# Patient Record
Sex: Female | Born: 1970 | Race: Black or African American | Hispanic: No | Marital: Single | Smoking: Never smoker
Health system: Southern US, Community
[De-identification: ages and names within clinical notes are randomized; demographics above are authoritative.]

## PROBLEM LIST (undated history)

## (undated) DIAGNOSIS — E119 Type 2 diabetes mellitus without complications: Secondary | ICD-10-CM

## (undated) DIAGNOSIS — J45909 Unspecified asthma, uncomplicated: Secondary | ICD-10-CM

## (undated) DIAGNOSIS — D649 Anemia, unspecified: Secondary | ICD-10-CM

## (undated) HISTORY — PX: CHOLECYSTECTOMY: SHX55

---

## 2017-06-11 DIAGNOSIS — J101 Influenza due to other identified influenza virus with other respiratory manifestations: Secondary | ICD-10-CM

## 2017-06-11 NOTE — ED Notes (Signed)
TRANSFER - IN REPORT:    [facility] on [employer]  being received from ER(unit) for routine progression of care      Report consisted of patient???s Situation, Background, Assessment and   Recommendations(SBAR).     Information from the following report(s) SBAR was reviewed with the receiving nurse.    Opportunity for questions and clarification was provided.      Assessment completed upon patient???s arrival to unit and care assumed.

## 2017-06-11 NOTE — ED Notes (Signed)
Patient assisted to the bathroom--  Then taken to EDOBS by wheelchair.

## 2017-06-11 NOTE — ED Notes (Signed)
Patient is alert.  Aware of plan of care.   Resting at this time.  No complaints.  Call light within reach.

## 2017-06-11 NOTE — ED Triage Notes (Signed)
Per EMS last VS, BP 139/91, HR 113, temp 98.9F, 100% on 4L NC.

## 2017-06-11 NOTE — ED Provider Notes (Signed)
EMERGENCY DEPARTMENT HISTORY AND PHYSICAL EXAM    8:11 PM      Date: 06/11/2017  Patient Name: Jennifer Brock    History of Presenting Illness     Chief Complaint   Patient presents with   ??? Respiratory Distress     Patient with history of asthma, diabetes presents as a transfer via EMS.  [facility], she is being treated for asthma exacerbation elevated blood sugar, had a CTA of the chest which identified new adenopathy, questionable sarcoidosis lymphoma or other malignancy, no pulmonologist was available there so she was transferred for further evaluation.  She is sleepy here, reports just receiving Ativan, she had reported seizure at 2 PM today, CT of the head was negative and was transferred for further management.  She has no pain at this time.    PCP: Other, Phys, MD    Current Facility-Administered Medications   Medication Dose Route Frequency Provider Last Rate Last Dose   ??? ondansetron (ZOFRAN) injection 4 mg  4 mg IntraVENous Q4H PRN Despina HickSharkey, Shaylynn Nulty M, MD       ??? 0.9% sodium chloride infusion  125 mL/hr IntraVENous CONTINUOUS Despina HickSharkey, Orson Rho M, MD 125 mL/hr at 06/11/17 2043 125 mL/hr at 06/11/17 2043   ??? albuterol (PROVENTIL VENTOLIN) nebulizer solution 2.5 mg  2.5 mg Nebulization Q2H PRN Despina HickSharkey, Shakoya Gilmore M, MD           Past History     Past Medical History:  No past medical history on file.    Past Surgical History:  No past surgical history on file.    Family History:  No family history on file.    Social History:  Social History     Tobacco Use   ??? Smoking status: Not on file   Substance Use Topics   ??? Alcohol use: Not on file   ??? Drug use: Not on file       Allergies:  No Known Allergies      Review of Systems     Review of Systems   Unable to perform ROS: Mental status change   Constitutional: Negative for fever.   HENT: Negative for nosebleeds.    Eyes: Negative for visual disturbance.   Respiratory: Positive for cough and shortness of breath.     Cardiovascular: Negative for chest pain and leg swelling.   Gastrointestinal: Negative for blood in stool.   Genitourinary: Negative for dysuria.   Musculoskeletal: Negative for myalgias.   Skin: Negative for rash.   Neurological: Negative for headaches.   All other systems reviewed and are negative.             Visit Vitals  BP 102/64   Pulse 92   Temp 98 ??F (36.7 ??C)   Resp 30   Ht 5\' 4"  (1.626 m)   Wt 76.7 kg (169 lb)   SpO2 99%   BMI 29.01 kg/m??           Physical Exam / Medical Decision Making   I am the first provider for this patient.    I reviewed the vital signs, available nursing notes, past medical history, past surgical history, family history and social history.    Vital Signs-Reviewed the patient's vital signs.  Physical exam:  General:  Well-developed, well-nourished, no apparent distress.    Head:  Normocephalic atraumatic.    Eyes:  Pupils midrange extraocular movements intact.  No pallor or conjunctival injection.    Nose:  No rhinorrhea, inspection grossly normal.    Ears:  Grossly normal to inspection, no discharge.    Mouth:  Mucous membranes moist, no appreciable intraoral lesion.    Neck/Back:  Trachea midline, no asymmetry.    Chest:  Grossly normal inspection, symmetric chest rise.    Pulmonary: Rhonchi throughout no wheezing or labored respirations.    Cardiovascular:  S1-S2 no murmurs rubs or gallops.    Abdomen: Soft, nontender, nondistended no guarding rebound or peritoneal signs.    Extremities:  Grossly normal to inspection, peripheral pulses intact  no pain on palpation of calves no asymmetry  Neurologic: Sleepy but arousable and able to hold a full conversation no appreciable focal neurologic deficit    Skin:  Warm and dry  Psychiatric:  Grossly normal mood and affect  Nursing note reviewed, vital signs reviewed.    ED course:  Patient presents with cough, recently diagnosed flu a positive, questionable seizure or syncope at [facility]ident today, CTA yesterday identifying  adenopathy questionable malignancy or lymphoma or sarcoidosis presents for evaluation, here she is tachypneic 100% on supplemental oxygen, no hypotension borderline tachycardia present..  She is sleepy here recently received 2 more grams of Ativan, likely related to that toxidrome but will repeat her blood sugar and potassium      On review of EMR HIV negative BNP normal blood blood cell count 13.76 hemoglobin 11.0, platelets 739 blood cultures no growth to date ??2 flu a positive on 06/09/17    Chest x-ray done today at 2:46 PM no acute findings    CT head done today without contrast at 2:44 PM negative CT of the head        Patient's presentation, history, physical exam and laboratory evaluations were reviewed.  I felt the patient would benefit from inpatient management and treatment.     Consult:  Discussed care with Dr. Corinda Gubler. Standard discussion; including history of patient???s chief complaint, available diagnostic results, and treatment course.  Patient was accepted to their service.          Disposition:    Admitted to medicine service      Portions of this chart were created with Dragon medical speech to text program.   Unrecognized errors may be present.          Diagnostic Study Results     Labs -  Recent Results (from the past 12 hour(s))   GLUCOSE, POC    Collection Time: 06/11/17  8:18 PM   Result Value Ref Range    Glucose (POC) 130 (H) 65 - 105 mg/dL       Radiologic Studies -   No orders to display       Diagnosis     Clinical Impression:   1. Influenza A    2. Abnormal CT of the chest          Follow-up Information    None             Medication List      You have not been prescribed any medications.

## 2017-06-11 NOTE — ED Notes (Signed)
TRANSFER - OUT REPORT:    Verbal report given  on Jennifer Brock  being transferred to EDOBS # 3 for routine progression of care       Report consisted of patient???s Situation, Background, Assessment and   Recommendations(SBAR).     Information from the following report(s) SBAR was reviewed with the receiving nurse.    Lines:       Opportunity for questions and clarification was provided.      Patient transported with:   [employer]

## 2017-06-11 NOTE — ED Triage Notes (Signed)
Jennifer JewsRhonda Brock ([facility]): 47 year old female (Full code) admitted for "asthma" continues to have shortness of breath with persistent wheezing and now found to have significant acute hilar adenopathy concerning for sarcoid or lymphoma.  They do not have a pulmonologist, patient continues to need hospitalization but they cannot go further after having maximized her treatment there.  Contact precautions for MRSA in nares  Flu positive.  CT scan of chest yesterday - abnormal adenopathy  Hx GERD, Seasonal allergies, asthma, sleep apnea, DM  Had a possible seizure at 1400, 2mg  IV ativan given, 2mg  Mg given  Lactic Acid 4.6, 1L NS  Potassium 2.3  23950mL/hr NS 40 mEq maintenance fluid.   AOx4  HIV panel & TB skin test (Rt lower forearm), Candidisis in (Lt Forearm) ordered at [facility]  98.0. P103, r20, 100% 3L NC, 125/85, no pain  20g RAC, 20g R hand  ETA 1830

## 2017-06-11 NOTE — H&P (Signed)
Hospitalist Admission History and Physical    NAME:  Jennifer Brock   DOB:   09-25-1970   MRN:   [id number]     PCP:  Sol Blazing, MD  Admission Date/Time:  06/11/2017 9:21 PM  Anticipated Date of Discharge: 2/23  Anticipated Disposition (home, SNF) : HH       Assessment/Plan:      Active Problems:    Influenza A (06/11/2017)       Asthma exacerbation  Acute influenza A  Abnormal perihilar mass and mediastinal lymphadenopathy  Uncontrolled type 2 diabetes mellitus with hyperglycemia new onset      ___________________________________________________  PLAN:    Admit to telemetry  We will consult pulmonary, for which the patient was transferred to our hospital rule out lymphoma  Glucommander for hyperglycemia  Continue treatment for influenza  Oxygen, nebs keep sats more than 92%  Continue montelukast, Symbicort    Risk of deterioration:  [] Low    [x] Moderate  [] High    Prophylaxis:  [] Lovenox  [] Coumadin  [x] Hep SQ  [] SCD???s  [] H2B/PPI    Disposition:  [] Home w/ Family   [x] HH PT,OT,RN   [] SNF/LTC   [] SAH/Rehab             Subjective:   CHIEF COMPLAINT:    Chief Complaint   Patient presents with   ??? Respiratory Distress       HISTORY OF PRESENT ILLNESS:     Jennifer Brock is a 47 y.o. BLACK OR [place] female who presents to outside hospital for shortness of breath and hyperglycemia  Was seen a few days before that for cough and was treated with steroids and cough medication and sent home but came back with worsening shortness of breath despite using duo nebs at home  She has history of asthma since a child and has been on inhaler for the same  Patient had evaluation as an inpatient done in procedure with steroids, magnesium with improvement of symptoms and later had CT scan of the chest performed which showed adenopathy suggestive of possible lymphoma versus sarcoidosis  She was started on insulin for hyperglycemia and was started amlodipine for hypertension in addition to replacement of potassium for hypokalemia         Past Medical History:   Diagnosis Date   ??? Asthma   ??? Diabetes   ??? GERD (gastroesophageal reflux disease)   ??? Hypertension   ??? Obesity   ??? Seasonal allergies   ??? Sleep apnea           Past Surgical History:   Procedure Laterality Date   ??? SUR - CHOLECYSTECTOMY   1995   ??? SUR - LIGATION TUBAL   1999           Social History     Tobacco Use   ??? Smoking status: Not on file   Substance Use Topics   ??? Alcohol use: Not on file          Social:  Family History   Problem Relation Name Age of Onset   ??? Hypertension Mother   ??? Asthma Paternal Uncle   ??? Asthma Maternal Uncle   ??? Asthma Other   Social History     Tobacco Use   ??? Smoking status: Never Smoker   ??? Smokeless tobacco: Never Used   Substance Use Topics   ??? Alcohol use: No   ??? Drug use: No         Prior to Admission medications   Medication  Sig Start Date End Date Taking? Authorizing Provider   albuterol (PROVENTIL, VENTOLIN) 90 mcg/Actuation Inhalation HFA Aerosol Inhaler Take 1-2 Puffs by inhalation every 4 hours as needed for Wheezing. 09/25/09 Yes Madigan, Herminio Heads, MD   albuterol-ipratropium (DUONEB) 0.5 mg-3 mg(2.5 mg base)/3 mL Inhalation Solution for Nebulization Take 3 mL by inhalation four times a day. 04/08/09 Yes Vanetta Shawl, DO   amLODIPine (NORVASC) 10 mg Oral Tablet Take 10 mg by mouth daily. Yes Reported, Patient   cetirizine (ZYRTEC) 10 mg Oral Tablet Take 1 Tab by mouth daily. For allergy and congestion 11/26/15 Yes Ripley Fraise, MD   codeine-guaifenesin (ROBITUSSIN-AC) 10-100 mg/5 mL Oral Liquid Take 5-10 mL by mouth four times a day as needed for Cough. 06/05/17 Yes Braswell, Karmen Bongo, PA-C   fluticasone-salmeterol (ADVAIR DISKUS) 500-50 mcg/dose Inhalation Disk with Device Take 1 Puff by inhalation twice a day. 07/10/10 Yes Massie Kluver, MD   furosemide (LASIX) 20 mg Oral Tablet Take 20 mg by mouth daily as needed (swelling). Yes Reported, Patient   gabapentin (NEURONTIN) 100 mg Oral Capsule Take 100 mg by mouth at  bedtime. Yes Reported, Patient   INSULIN DETEMIR (LEVEMIR FLEXPEN SUBQ) Give 20 Units subcutaneous at bedtime. Yes Reported, Patient   magnesium oxide (MAG-OX) 400 mg Oral Tablet Take 1 Tab by mouth daily. 02/06/17 Yes Massie Kluver, MD   montelukast (SINGULAIR) 10 mg Oral Tab Take 10 mg by mouth every evening. Yes Reported, Patient   predniSONE (DELTASONE) 20 mg Oral Tablet Take 2 Tabs by mouth with breakfast for 5 days. 06/05/17 06/10/17 Yes Braswell, Karmen Bongo, PA-C   TRADJENTA 5 mg Oral Tablet Take 5 mg by mouth daily. 04/02/16 Yes Reported, Patient               None           REVIEW OF SYSTEMS:     []  Unable to obtain  ROS due to  [] mental status change  [] sedated   [] intubated   [x] Total of 12 systems reviewed as follows:  Constitutional: negative fever, negative chills, negative weight loss  Eyes:   negative visual changes  ENT:   negative sore throat, tongue or lip swelling  Respiratory:  Positive For shortness of breath  Cards:  negative for chest pain, palpitations, lower extremity edema  GI:   negative for nausea, vomiting, diarrhea, and abdominal pain  Genitourinary: negative for frequency, dysuria  Integument:  negative for rash and pruritus  Hematologic:  negative for easy bruising and gum/nose bleeding  Musculoskel: negative for myalgias,  back pain and muscle weakness  Neurological:  negative for headaches, dizziness, vertigo  Behavl/Psych: negative for feelings of anxiety, depression       Objective:   VITALS:    Visit Vitals  BP 102/64   Pulse 92   Temp 98 ??F (36.7 ??C)   Resp 30   Ht 5\' 4"  (1.626 m)   Wt 76.7 kg (169 lb)   SpO2 99%   BMI 29.01 kg/m??     Temp (24hrs), Avg:98 ??F (36.7 ??C), Min:98 ??F (36.7 ??C), Max:98 ??F (36.7 ??C)      PHYSICAL EXAM:   General:    Chronically sick looking in no apparent distress  Head:   Normocephalic, without obvious abnormality, atraumatic.  Eyes:   Conjunctivae clear, anicteric sclerae.  Pupils are equal  Nose:  Nares normal. No drainage or sinus tenderness.   Throat:    Lips, mucosa, and tongue normal.  No  Thrush  Neck:  Supple, symmetrical,  no adenopathy, thyroid: non tender    no carotid bruit and no JVD.  Back:    Symmetric,  No CVA tenderness.  Lungs:   Bilateral expiratory wheezing, decreased breath sound at the base  Chest wall:  No tenderness or deformity. No Accessory muscle use.  Heart:   Regular rate and rhythm,  no murmur, rub or gallop.  Abdomen:   Soft, non-tender. Not distended.  Bowel sounds normal. No masses  Extremities: Extremities normal, atraumatic, No cyanosis.  No edema. No clubbing  Skin:     Texture, turgor normal. No rashes or lesions.  Not Jaundiced  Psych:  Flat  effect.  Neurologic: EOMs intact. No facial asymmetry. No aphasia or slurred speech. Normal strength, Alert and oriented X 3.       LAB DATA REVIEWED:    Recent Results (from the past 12 hour(s))   GLUCOSE, POC    Collection Time: 06/11/17  8:18 PM   Result Value Ref Range    Glucose (POC) 130 (H) 65 - 105 mg/dL         Radiological Studies:   Results for orders placed or performed during the hospital encounter of 06/07/17 (from the past 168 hour(s))   XRAY CHEST 1 VIEW   Collection Time: 06/07/17 6:21 AM   Narrative   ONE VIEW CHEST    Clinical Indication: Cough.    Technique: Single frontal view of the chest was obtained.    Comparison: Multiple priors, most recently 06/05/2017.     Findings: The cardiomediastinal silhouette is normal in size and   configuration. Airway is midline. Pulmonary vasculature is normal in   appearance. There is no evidence of pulmonary consolidation or pleural   effusion. Visualized osseous structures are unremarkable.    Impression   Impression:   No acute cardiopulmonary process.    Reading Doctor: Madelaine BhatBonafe, Tiffany    Electronic Signature by: Madelaine BhatBonafe, Tiffany    CTA CHEST W & W/O IV CONTRAST   Collection Time: 06/10/17 1:54 PM   Narrative   CTA SCAN OF THE CHEST WITH CONTRAST    HISTORY: Effusion and bradycardia    COMPARISON: Chest x-ray 06/07/2017     TECHNIQUE: Thin slice axial images were obtained with 100 cc's of   Omnipaque 350 contrast. Multiplanar images were also obtained. Maximum   intensity projection algorithms were utilized in all CTA studies.     FINDINGS:    Thoracic Aorta: Normal sized thoracic aorta. No dissection.    Adenopathy: There is a prominent lymph node near the aortopulmonary window  on the left side measuring 15 x 25 mm in size. There also some mediastinal  lymph nodes with a short axis of 12 mm. There are subcarinal lymph nodes   present. There are also small hilar lymph nodes and 1 left perihilar mass   or lymph node extending up to the left upper lobe measuring 12 x 14 mm.Marland Kitchen.    Heart: Normal size heart. No pericardial effusion.    Lungs: Besides the left suprahilar density, no other masses, infiltrates,   pleural effusions or pneumothorax is evident..    Pulmonary arteries: No acute pulmonary most is identified.    Upper Abdomen: Clips from a previous cholecystectomy.    Skeletal Structures: No acute findings..    Impression   IMPRESSION:    1. No acute pulmonary embolus is identified  2. Abnormal adenopathy as described. The differential would include   lymphoma, metastatic disease  and granulomatous disease including   sarcoidosis.    Reading Doctor: Jeannine Kitten  Electronic Signature by: Jeannine Kitten   Results for orders placed or performed during the hospital encounter of 06/05/17 (from the past 168 hour(s))   XRAY CHEST 2 VIEWS   Collection Time: 06/05/17 5:47 PM   Narrative   INDICATION:   cough, fever, wheezing; hx of asthma .    TECHNIQUE:   PA and lateral    COMPARISON: 02/06/2017    FINDINGS:   The bilateral lung fields are clear. The cardiac silhouette is within   normal limits. The remaining osseous and soft tissue structures are   unremarkable.    Impression   IMPRESSION:   1. No radiographic evidence of acute chest disease.               Care Plan discussed with:      [x] Patient   [] Family    [] ED Care Manager  [] ED Doc   [] Specialist :    Total care time spent on reviewing the case/data/notes/EMR,examining the patient,documentation,coordinating care with nurses and consultants is 40 minutes.       ___________________________________________________  Admitting Physician: Roderic Scarce, MD     Dragon medical dictation software was used for portions of this report.  Unintended voice transcription errors may have occurred.

## 2017-06-11 NOTE — ED Notes (Signed)
Care assumed.  Bedside report from Jennifer E.  Patient slept through report.  Patient on contact and droplet precautions.  Isolation  Cart outside the room and signs up.  Whiteboard updated  Expectation is for admission--

## 2017-06-12 ENCOUNTER — Inpatient Hospital Stay
Admit: 2017-06-12 | Discharge: 2017-06-13 | Disposition: A | Discharge status: Home / Self Care | Payer: MEDICARE | Attending: Internal Medicine | Admitting: Internal Medicine

## 2017-06-12 LAB — TROPONIN I
Troponin-I: 0.023 ng/ml (ref 0.000–0.045)
Troponin-I: <0.015 ng/ml (ref 0.000–0.045)
Troponin-I: <0.015 ng/ml (ref 0.000–0.045)

## 2017-06-12 LAB — METABOLIC PANEL, BASIC
Anion gap: 9 mmol/L (ref 5–15)
BUN: 7 mg/dl (ref 7–25)
CO2: 29 mEq/L (ref 21–32)
Calcium: 8.1 mg/dl — ABNORMAL LOW (ref 8.5–10.1)
Chloride: 102 mEq/L (ref 98–107)
Creatinine: 0.5 mg/dl — ABNORMAL LOW (ref 0.6–1.3)
GFR est AA: >60
GFR est non-AA: >60
Glucose: 81 mg/dl (ref 74–106)
Potassium: 3.3 mEq/L — ABNORMAL LOW (ref 3.5–5.1)
Sodium: 141 mEq/L (ref 136–145)

## 2017-06-12 LAB — CBC W/O DIFF
HCT: 34.4 % — ABNORMAL LOW (ref 37.0–50.0)
HGB: 10.3 gm/dl — ABNORMAL LOW (ref 13.0–17.2)
MCH: 22.2 pg — ABNORMAL LOW (ref 25.4–34.6)
MCHC: 29.9 gm/dl — ABNORMAL LOW (ref 30.0–36.0)
MCV: 74.3 fL — ABNORMAL LOW (ref 80.0–98.0)
MPV: 9.3 fL (ref 6.0–10.0)
PLATELET: 641 10*3/uL — ABNORMAL HIGH (ref 140–450)
RBC: 4.63 M/uL (ref 3.60–5.20)
RDW-SD: 47.4 — ABNORMAL HIGH (ref 36.4–46.3)
WBC: 9 10*3/uL (ref 4.0–11.0)

## 2017-06-12 LAB — GLUCOSE, POC
Glucose (POC): 130 mg/dL — ABNORMAL HIGH (ref 65–105)
Glucose (POC): 73 mg/dL (ref 65–105)
Glucose (POC): 80 mg/dL (ref 65–105)
Glucose (POC): 231 mg/dL — ABNORMAL HIGH (ref 65–105)
Glucose (POC): 382 mg/dL — ABNORMAL HIGH (ref 65–105)

## 2017-06-12 LAB — POTASSIUM: Potassium: 2.8 mEq/L — CL (ref 3.5–5.1)

## 2017-06-12 MED ORDER — NALOXONE 0.4 MG/ML INJECTION
0.4 mg/mL | INTRAMUSCULAR | Status: DC | PRN
Start: 2017-06-12 — End: 2017-06-13

## 2017-06-12 MED ORDER — PREDNISONE 20 MG TAB
20 mg | Freq: Once | ORAL | Status: DC
Start: 2017-06-12 — End: 2017-06-13

## 2017-06-12 MED ORDER — INSULIN LISPRO 100 UNIT/ML INJECTION
100 unit/mL | Freq: Four times a day (QID) | SUBCUTANEOUS | Status: DC
Start: 2017-06-12 — End: 2017-06-13
  Administered 2017-06-12 – 2017-06-13 (×5): via SUBCUTANEOUS

## 2017-06-12 MED ORDER — HEPARIN (PORCINE) 5,000 UNIT/ML IJ SOLN
5000 unit/mL | Freq: Three times a day (TID) | INTRAMUSCULAR | Status: DC
Start: 2017-06-12 — End: 2017-06-13
  Administered 2017-06-12 – 2017-06-13 (×6): via SUBCUTANEOUS

## 2017-06-12 MED ORDER — PREDNISONE 10 MG TAB
10 mg | Freq: Once | ORAL | Status: DC
Start: 2017-06-12 — End: 2017-06-13

## 2017-06-12 MED ORDER — ONDANSETRON (PF) 4 MG/2 ML INJECTION
4 mg/2 mL | INTRAMUSCULAR | Status: DC | PRN
Start: 2017-06-12 — End: 2017-06-13

## 2017-06-12 MED ORDER — INSULIN GLARGINE 100 UNIT/ML INJECTION
100 unit/mL | Freq: Every evening | SUBCUTANEOUS | Status: DC
Start: 2017-06-12 — End: 2017-06-13
  Administered 2017-06-12 – 2017-06-13 (×2): via SUBCUTANEOUS

## 2017-06-12 MED ORDER — ALBUTEROL SULFATE 0.083 % (0.83 MG/ML) SOLN FOR INHALATION
2.5 mg /3 mL (0.083 %) | RESPIRATORY_TRACT | Status: DC | PRN
Start: 2017-06-12 — End: 2017-06-13

## 2017-06-12 MED ORDER — SODIUM CHLORIDE 0.9 % IV
INTRAVENOUS | Status: DC
Start: 2017-06-12 — End: 2017-06-11
  Administered 2017-06-12: 02:00:00 via INTRAVENOUS

## 2017-06-12 MED ORDER — INSULIN LISPRO 100 UNIT/ML INJECTION
100 unit/mL | SUBCUTANEOUS | Status: DC | PRN
Start: 2017-06-12 — End: 2017-06-13
  Administered 2017-06-13: 14:00:00 via SUBCUTANEOUS

## 2017-06-12 MED ORDER — ALBUTEROL SULFATE 2.5 MG/0.5 ML NEB SOLUTION
2.5 mg/0.5 mL | Freq: Four times a day (QID) | RESPIRATORY_TRACT | Status: DC
Start: 2017-06-12 — End: 2017-06-13
  Administered 2017-06-12 – 2017-06-13 (×5): via RESPIRATORY_TRACT

## 2017-06-12 MED ORDER — AMLODIPINE 5 MG TAB
5 mg | Freq: Every day | ORAL | Status: DC
Start: 2017-06-12 — End: 2017-06-11

## 2017-06-12 MED ORDER — BUDESONIDE-FORMOTEROL HFA 160 MCG-4.5 MCG/ACTUATION AEROSOL INHALER
Freq: Two times a day (BID) | RESPIRATORY_TRACT | Status: DC
Start: 2017-06-12 — End: 2017-06-11

## 2017-06-12 MED ORDER — SODIUM CHLORIDE 0.9 % IJ SYRG
INTRAMUSCULAR | Status: DC | PRN
Start: 2017-06-12 — End: 2017-06-13

## 2017-06-12 MED ORDER — POTASSIUM CHLORIDE SR 20 MEQ TAB, PARTICLES/CRYSTALS
20 mEq | ORAL | Status: AC
Start: 2017-06-12 — End: 2017-06-12
  Administered 2017-06-12: 07:00:00 via ORAL

## 2017-06-12 MED ORDER — OSELTAMIVIR PHOSPHATE 75 MG CAP
75 mg | Freq: Two times a day (BID) | ORAL | Status: DC
Start: 2017-06-12 — End: 2017-06-13
  Administered 2017-06-12 – 2017-06-13 (×3): via ORAL

## 2017-06-12 MED ORDER — DEXTROSE 50% IN WATER (D50W) IV
INTRAVENOUS | Status: DC | PRN
Start: 2017-06-12 — End: 2017-06-13

## 2017-06-12 MED ORDER — SODIUM CHLORIDE 0.9 % IJ SYRG
Freq: Three times a day (TID) | INTRAMUSCULAR | Status: DC
Start: 2017-06-12 — End: 2017-06-13
  Administered 2017-06-12 – 2017-06-13 (×6): via INTRAVENOUS

## 2017-06-12 MED ORDER — FLUTICASONE 200 MCG-VILANTEROL 25 MCG/DOSE BREATH ACTIVATED INHALER
200-25 mcg/dose | Freq: Every day | RESPIRATORY_TRACT | Status: DC
Start: 2017-06-12 — End: 2017-06-13
  Administered 2017-06-12 – 2017-06-13 (×2): via RESPIRATORY_TRACT

## 2017-06-12 MED ORDER — PREDNISONE 20 MG TAB
20 mg | Freq: Once | ORAL | Status: AC
Start: 2017-06-12 — End: 2017-06-12
  Administered 2017-06-12: 17:00:00 via ORAL

## 2017-06-12 MED ORDER — OSELTAMIVIR PHOSPHATE 75 MG CAP
75 mg | ORAL | Status: AC
Start: 2017-06-12 — End: 2017-06-11
  Administered 2017-06-12 (×2): via ORAL

## 2017-06-12 MED ORDER — MONTELUKAST 10 MG TAB
10 mg | Freq: Every evening | ORAL | Status: DC
Start: 2017-06-12 — End: 2017-06-13
  Administered 2017-06-12 – 2017-06-13 (×2): via ORAL

## 2017-06-12 MED ORDER — SODIUM CHLORIDE 0.9 % IV
INTRAVENOUS | Status: DC
Start: 2017-06-12 — End: 2017-06-12
  Administered 2017-06-12: 02:00:00 via INTRAVENOUS

## 2017-06-12 MED ORDER — GLUCAGON 1 MG INJECTION
1 mg | INTRAMUSCULAR | Status: DC | PRN
Start: 2017-06-12 — End: 2017-06-13

## 2017-06-12 MED ORDER — PREDNISONE 20 MG TAB
20 mg | Freq: Once | ORAL | Status: AC
Start: 2017-06-12 — End: 2017-06-13
  Administered 2017-06-13: 16:00:00 via ORAL

## 2017-06-12 MED FILL — HEPARIN (PORCINE) 5,000 UNIT/ML IJ SOLN: 5000 unit/mL | INTRAMUSCULAR | Qty: 1

## 2017-06-12 MED FILL — PREDNISONE 10 MG TAB: 10 mg | ORAL | Qty: 5

## 2017-06-12 MED FILL — PREDNISONE 10 MG TAB: 10 mg | ORAL | Qty: 1

## 2017-06-12 MED FILL — INSULIN LISPRO 100 UNIT/ML INJECTION: 100 unit/mL | SUBCUTANEOUS | Qty: 5

## 2017-06-12 MED FILL — INSULIN LISPRO 100 UNIT/ML INJECTION: 100 unit/mL | SUBCUTANEOUS | Qty: 7

## 2017-06-12 MED FILL — INSULIN GLARGINE 100 UNIT/ML INJECTION: 100 unit/mL | SUBCUTANEOUS | Qty: 1

## 2017-06-12 MED FILL — ALBUTEROL SULFATE 2.5 MG/0.5 ML NEB SOLUTION: 2.5 mg/0.5 mL | RESPIRATORY_TRACT | Qty: 0.5

## 2017-06-12 MED FILL — OSELTAMIVIR PHOSPHATE 75 MG CAP: 75 mg | ORAL | Qty: 1

## 2017-06-12 MED FILL — PREDNISONE 20 MG TAB: 20 mg | ORAL | Qty: 3

## 2017-06-12 MED FILL — MONTELUKAST 10 MG TAB: 10 mg | ORAL | Qty: 1

## 2017-06-12 MED FILL — BREO ELLIPTA 200 MCG-25 MCG/DOSE POWDER FOR INHALATION: 200-25 mcg/dose | RESPIRATORY_TRACT | Qty: 28

## 2017-06-12 NOTE — Consults (Signed)
[facility]       Pulmonary Consult Note    Patient: Jennifer Brock               Sex: female          DOA: 06/11/2017       Date of Birth:  09/03/1970      Age:  47 y.o.            Consult requested by Dr. Corinda Gubler  Reason for Consult: Mediastinal and hilar lymphadenopathy    ASSESSMENT:     -Influenza A infection  -Asthma exacerbation: Likely due to influenza infection  -History of moderate persistent asthma and allergic rhinitis  -Mild hilar and mediastinal lymphadenopathy on CT scan chest  -Type 2 diabetes mellitus: Uncontrolled  -Hypertension    PLAN:      -Wean FiO2 to keep SaO2>90%.  She is currently on room air.  -I reviewed report of CT scan chest which was done at [facility].  Unfortunately, I do not have CD to review images myself.  Report mentioned no pulmonary embolism and 1.5-2 cm mediastinal and hilar lymph nodes.  This could be due to sarcoidosis, reactive lymphadenopathy, or less likely from lymphoma.   -She is having asthma exacerbation and recent influenza infection.  She cannot undergo bronchoscopy at this time.  -I will see her in office next week with a CT scan chest and decide further workup based on review of CT scan myself.  -Start prednisone 60 mg with taper.  -Continue Brio Ellipta 1 puff daily.  -Start albuterol nebulizer every 6 hours for now.  -Continue Singulair.  -Continue Tamiflu for total 5 days.  She was already treated for that at other hospital.  I will consider stopping it tomorrow.  -She is taking p.o.  Can discontinue IV fluids.  -Diabetes management per Glucomander protocol.  -Heparin SQ for DVT prophylaxis    Explained assessment and plan to the patient and answered questions.   Discussed with Dr. Fara Chute.   Discussed with the nurse and RT.  Thanks for the consult.       HPI:   Jennifer Brock is a 48 y.o. female who was transferred to [facility] from [facility] hospital yesterday.  She has history of asthma,  allergic rhinitis and diabetes mellitus.  She uses Advair, Singulair, albuterol inhaler and DuoNeb as needed at home.  Her last asthma exacerbation was 6 months ago.  She was evaluated at emergency room about a week ago for worsening shortness of breath.  She was checked for flu and it was negative.  She was told that she had a viral infection and was discharged from the emergency room.  She developed significant worsening of her respiratory status and was taken to emergency room next day.  She was diagnosed with influenza A infection.  She was treated with steroids and Tamiflu.  She started getting better.  She had a CT scan chest with contrast later on to rule out pulmonary embolism.  CTA did not show pulmonary embolism but showed mediastinal and hilar lymphadenopathy for which she was transferred to [facility].  She is not aware of any prior abnormality on her chest x-ray or CT scan chest.  She denies low-grade fever, night sweats, loss of appetite or weight loss.  No other lymphadenopathy.  Pulmonary consult was requested to help with further management.    Past Medical History  Asthma  Allergic rhinitis  Diabetes mellitus  GERD  Hypertension  Obesity  Obstructive sleep apnea    Past Surgical History  Cholecystectomy  Tubal ligation    Family History  She has strong family history of asthma.  No family history of sarcoidosis.    Social History  She was never a serious smoker.  She does not have any pets.  She denies use of alcohol or illicit drug use.  Social History     Tobacco Use   ??? Smoking status: Not on file   Substance Use Topics   ??? Alcohol use: Not on file   ??? Drug use: Not on file        Medications  She was using Advair, Singulair, albuterol inhaler and DuoNeb at home.  Prior to Admission medications    Not on File       Current Facility-Administered Medications   Medication Dose Route Frequency   ??? ondansetron (ZOFRAN) injection 4 mg  4 mg IntraVENous Q4H PRN   ??? albuterol  (PROVENTIL VENTOLIN) nebulizer solution 2.5 mg  2.5 mg Nebulization Q2H PRN   ??? montelukast (SINGULAIR) tablet 10 mg  10 mg Oral QHS   ??? oseltamivir (TAMIFLU) capsule 75 mg  75 mg Oral BID   ??? sodium chloride (NS) flush 5-10 mL  5-10 mL IntraVENous Q8H   ??? sodium chloride (NS) flush 5-10 mL  5-10 mL IntraVENous PRN   ??? naloxone (NARCAN) injection 0.1 mg  0.1 mg IntraVENous PRN   ??? heparin (porcine) injection 5,000 Units  5,000 Units SubCUTAneous Q8H   ??? 0.9% sodium chloride infusion  75 mL/hr IntraVENous CONTINUOUS   ??? dextrose (D50) infusion 5-25 g  10-50 mL IntraVENous PRN   ??? glucagon (GLUCAGEN) injection 1 mg  1 mg IntraMUSCular PRN   ??? insulin glargine (LANTUS) injection 1-100 Units  1-100 Units SubCUTAneous QHS   ??? insulin lispro (HUMALOG) injection   SubCUTAneous AC&HS   ??? insulin lispro (HUMALOG) injection 1-100 Units  1-100 Units SubCUTAneous PRN   ??? fluticasone-vilanterol (BREO ELLIPTA) 23600mcg-25mcg/puff  1 Puff Inhalation DAILY     No current outpatient medications on file.       Allergy  No Known Allergies    Review of Systems  A comprehensive review of system was performed and was otherwise negative except for as mentioned in HPI.    Physical Exam:   Vital Signs:    Visit Vitals  BP 120/78 (BP Patient Position: At rest)   Pulse 78   Temp 97.8 ??F (36.6 ??C)   Resp 18   Ht 5\' 4"  (1.626 m)   Wt 76.7 kg (169 lb)   SpO2 99%   BMI 29.01 kg/m??       O2 Device: Room air   O2 Flow Rate (L/min): 2 l/min   Temp (24hrs), Avg:98 ??F (36.7 ??C), Min:97.8 ??F (36.6 ??C), Max:98.2 ??F (36.8 ??C)       Intake/Output:   Last shift:      No intake/output data recorded.  Last 3 shifts: No intake/output data recorded.  No intake or output data in the 24 hours ending 06/12/17 0742     GENERAL: Awake, alert, comfortable  HEENT: Oral mucosa moist. No thrush.   NECK: Supple. Trachea midline.   RESPIRATORY: Bilateral BS present, bilateral rhonchi present.  CARDIOVASCULAR: S1 and S2 present.   ABDOMEN: Soft and nontender with positive  bowel sounds. No organomegaly.   NEUROLOGICAL: Conscious, no focal weakness.      Labs Reviewed:  Recent Results (from the  past 48 hour(s))   GLUCOSE, POC    Collection Time: 06/11/17  8:18 PM   Result Value Ref Range    Glucose (POC) 130 (H) 65 - 105 mg/dL   POTASSIUM    Collection Time: 06/12/17 12:25 AM   Result Value Ref Range    Potassium 2.8 (LL) 3.5 - 5.1 mEq/L   TROPONIN I    Collection Time: 06/12/17 12:25 AM   Result Value Ref Range    Troponin-I 0.023 0.000 - 0.045 ng/ml   TROPONIN I    Collection Time: 06/12/17  3:30 AM   Result Value Ref Range    Troponin-I <0.015 0.000 - 0.045 ng/ml   CBC W/O DIFF    Collection Time: 06/12/17  6:10 AM   Result Value Ref Range    WBC 9.0 4.0 - 11.0 1000/mm3    RBC 4.63 3.60 - 5.20 M/uL    HGB 10.3 (L) 13.0 - 17.2 gm/dl    HCT 16.1 (L) 09.6 - 50.0 %    MCV 74.3 (L) 80.0 - 98.0 fL    MCH 22.2 (L) 25.4 - 34.6 pg    MCHC 29.9 (L) 30.0 - 36.0 gm/dl    PLATELET 045 (H) 409 - 450 1000/mm3    MPV 9.3 6.0 - 10.0 fL    RDW-SD 47.4 (H) 36.4 - 46.3     METABOLIC PANEL, BASIC    Collection Time: 06/12/17  6:10 AM   Result Value Ref Range    Sodium 141 136 - 145 mEq/L    Potassium 3.3 (L) 3.5 - 5.1 mEq/L    Chloride 102 98 - 107 mEq/L    CO2 29 21 - 32 mEq/L    Glucose 81 74 - 106 mg/dl    BUN 7 7 - 25 mg/dl    Creatinine 0.5 (L) 0.6 - 1.3 mg/dl    GFR est AA >81.1      GFR est non-AA >60      Calcium 8.1 (L) 8.5 - 10.1 mg/dl    Anion gap 9 5 - 15 mmol/L   TROPONIN I    Collection Time: 06/12/17  6:10 AM   Result Value Ref Range    Troponin-I <0.015 0.000 - 0.045 ng/ml   GLUCOSE, POC    Collection Time: 06/12/17  6:13 AM   Result Value Ref Range    Glucose (POC) 73 65 - 105 mg/dL             Imaging:  I reviewed report of CT scan chest.  I was not able to review images myself.        Nanetta Batty, MD  [phone]      Dragon medical dictation software was used for portions of this report.  Unintended voice transcription errors may have occurred.

## 2017-06-12 NOTE — Progress Notes (Signed)
Medical Progress Note      NAME: Jennifer Brock   DOB:  04-17-1971  MRM:  [id number]    Date/Time: 06/12/2017  11:18 AM     Subjective:     Jennifer Brock is a 47 year old woman with history of asthma treated at a local hospital for an asthma exacerbation and influenza. A CT of the chest was done while there that shows some mediastinal adenopathy. She has been transferred here for a pulmonary evaluation and further ongoing treatment for her asthma. She is feeling better since her initial admission.     Past Medical History reviewed and unchanged from Admission History and Physical    Review of Systems   Constitutional: Positive for fatigue.   HENT: Negative.    Eyes: Negative.    Respiratory: Positive for shortness of breath and wheezing.    Cardiovascular: Negative.    Gastrointestinal: Negative.    Endocrine: Negative.    Genitourinary: Negative.    Musculoskeletal: Negative.    Allergic/Immunologic: Negative.    Neurological: Negative.    Hematological: Negative.    Psychiatric/Behavioral: Negative.             Objective:       Vitals:      Last 24hrs VS reviewed since prior progress note. Most recent are:    Visit Vitals  BP 117/75 (BP Patient Position: Supine)   Pulse (!) 104   Temp 98 ??F (36.7 ??C)   Resp 18   Ht 5\' 4"  (1.626 m)   Wt 76.7 kg (169 lb)   SpO2 95%   BMI 29.01 kg/m??     SpO2 Readings from Last 6 Encounters:   06/12/17 95%    O2 Flow Rate (L/min): 2 l/min   No intake or output data in the 24 hours ending 06/12/17 1118       Exam:      Physical Exam   Nursing note and vitals reviewed.  Constitutional: She is oriented to person, place, and time. She appears well-developed and well-nourished.   HENT:   Head: Normocephalic and atraumatic.   Eyes: EOM are normal. Pupils are equal, round, and reactive to light.   Neck: Normal range of motion. Neck supple.   Cardiovascular: Normal rate and regular rhythm.   Pulmonary/Chest: She has wheezes. She has no rales.   Abdominal: Soft. Bowel sounds are normal.    Musculoskeletal: Normal range of motion. She exhibits no edema.   Neurological: She is alert and oriented to person, place, and time.   Skin: Skin is warm and dry.     Lab Data Reviewed: (see below)      Medications:  Current Facility-Administered Medications   Medication Dose Route Frequency   ??? predniSONE (DELTASONE) tablet 60 mg  60 mg Oral ONCE    Followed by   ??? [START ON 06/13/2017] predniSONE (DELTASONE) tablet 50 mg  50 mg Oral ONCE    Followed by   ??? [START ON 06/14/2017] predniSONE (DELTASONE) tablet 40 mg  40 mg Oral ONCE    Followed by   ??? [START ON 06/15/2017] predniSONE (DELTASONE) tablet 30 mg  30 mg Oral ONCE    Followed by   ??? [START ON 06/16/2017] predniSONE (DELTASONE) tablet 20 mg  20 mg Oral ONCE    Followed by   ??? [START ON 06/17/2017] predniSONE (DELTASONE) tablet 10 mg  10 mg Oral ONCE   ??? albuterol CONCENTRATE 2.5mg /0.5 mL neb soln  2.5 mg Nebulization Q6H RT   ???  ondansetron (ZOFRAN) injection 4 mg  4 mg IntraVENous Q4H PRN   ??? albuterol (PROVENTIL VENTOLIN) nebulizer solution 2.5 mg  2.5 mg Nebulization Q2H PRN   ??? montelukast (SINGULAIR) tablet 10 mg  10 mg Oral QHS   ??? oseltamivir (TAMIFLU) capsule 75 mg  75 mg Oral BID   ??? sodium chloride (NS) flush 5-10 mL  5-10 mL IntraVENous Q8H   ??? sodium chloride (NS) flush 5-10 mL  5-10 mL IntraVENous PRN   ??? naloxone (NARCAN) injection 0.1 mg  0.1 mg IntraVENous PRN   ??? heparin (porcine) injection 5,000 Units  5,000 Units SubCUTAneous Q8H   ??? dextrose (D50) infusion 5-25 g  10-50 mL IntraVENous PRN   ??? glucagon (GLUCAGEN) injection 1 mg  1 mg IntraMUSCular PRN   ??? insulin glargine (LANTUS) injection 1-100 Units  1-100 Units SubCUTAneous QHS   ??? insulin lispro (HUMALOG) injection   SubCUTAneous AC&HS   ??? insulin lispro (HUMALOG) injection 1-100 Units  1-100 Units SubCUTAneous PRN   ??? fluticasone-vilanterol (BREO ELLIPTA) 212mcg-25mcg/puff  1 Puff Inhalation DAILY     No current outpatient medications on file.        ______________________________________________________________________      Lab Review:     Recent Labs     06/12/17  0610   WBC 9.0   HGB 10.3*   HCT 34.4*   PLT 641*     Recent Labs     06/12/17  0610 06/12/17  0025   NA 141  --    K 3.3* 2.8*   CL 102  --    CO2 29  --    GLU 81  --    BUN 7  --    CREA 0.5*  --    CA 8.1*  --           Assessment:     Active Problems:    Influenza A (06/11/2017)           Plan:     Risk of deterioration: medium             1. Acute asthma exacerbation. Continue HHN's and ongoing pulmonary toilet  2. Influenza infection. Continue/finish tamiflu course  3. Mediastinal adenopathy. Per pulmonary WIll likely need outpatient FU for further diagnostic testing since current asthma exacerbation  4. DM II new onset vs newly recognized. Continue glucommander protocols for hyperglycemia  5. Tapering corticosteroids.     DVT prophylaxis with subQ heparin  Full code status  ADA diet         Total time spent with patient: 30 Minutes                  Care Plan discussed with: Patient, Nursing Staff, Consultant/Specialist and >50% of time spent in counseling and coordination of care    Discussed:  Care Plan    Prophylaxis:  Hep SQ and SCD's    Disposition:  Home w/Family           ___________________________________________________    Attending Physician: Marlene Bast, MD

## 2017-06-12 NOTE — ED Notes (Signed)
TRANSFER - OUT REPORT:    Verbal report given to Tinnie GensJeffrey, RN on [employer]  being transferred to 6E) for routine progression of care       Report consisted of patient???s Situation, Background, Assessment and   Recommendations(SBAR).     Information from the following report(s) SBAR, ED Summary, [facility] and Recent Results was reviewed with the receiving nurse.    Lines:       Opportunity for questions and clarification was provided.      Patient transported with:   [employer]

## 2017-06-12 NOTE — ED Notes (Signed)
TRANSFER - IN REPORT:    Verbal report received from [facility], RN on [employer] [facility] .      Report consisted of patient???s Situation, Background, Assessment and   Recommendations(SBAR).     Information from the following report(s) SBAR, Kardex, ED Summary, MAR, Recent Results and Cardiac Rhythm sinus tachycardia was reviewed with the receiving nurse.    Opportunity for questions and clarification was provided.      Assessment completed upon patient???s care assumed.

## 2017-06-12 NOTE — Consults (Signed)
[facility]     Pulmonary Consult Note    Patient: Jennifer JewsRhonda Grissett               Sex: female          DOA: 06/11/2017       Date of Birth:  December 26, 1970      Age:  47 y.o.            Consult requested by Dr. Corinda GublerVerma  Reason for Consult: Mediastinal and hilar lymphadenopathy    ASSESSMENT:     -Influenza A infection  -Asthma exacerbation: Likely due to influenza infection  -History of moderate persistent asthma and allergic rhinitis  -Mild hilar and mediastinal lymphadenopathy on CT scan chest  -Type 2 diabetes mellitus: Uncontrolled  -Hypertension    PLAN:      -Wean FiO2 to keep SaO2>90%.  She is currently on room air.  -I reviewed [facility]  Unfortunately, I do not have CD to review images myself.  Report mentioned no pulmonary embolism and 1.5-2 cm mediastinal and hilar lymph nodes.  This could be due to sarcoidosis, reactive lymphadenopathy, or less likely from lymphoma.   -She is having asthma exacerbation and recent influenza infection.  She cannot undergo bronchoscopy at this time.  -I will see her in office next week with a CT scan chest and decide further workup based on review of CT scan myself.  -Start prednisone 60 mg with taper.  -Continue Brio Ellipta 1 puff daily.  -Start albuterol nebulizer every 6 hours for now.  -Continue Singulair.  -Continue Tamiflu for total 5 days.  She was already treated for that at other hospital.  I will consider stopping it tomorrow.  -She is taking p.o.  Can discontinue IV fluids.  -Diabetes management per Glucomander protocol.  -Heparin SQ for DVT prophylaxis    Explained assessment and plan to the patient and answered questions.   Discussed with Dr. Fara ChuteHolladay.   Discussed with the nurse and RT.  Thanks for the consult.       HPI:   Jennifer Brock is a 47 y.o. female who [facility]  She has history  of asthma, allergic rhinitis and diabetes mellitus.  She uses Advair, Singulair, albuterol inhaler and DuoNeb as needed at home.  Her last asthma exacerbation was 6 months ago.  She was evaluated at emergency room about a week ago for worsening shortness of breath.  She was checked for flu and it was negative.  She was told that she had a viral infection and was discharged from the emergency room.  She developed significant worsening of her respiratory status and was taken to emergency room next day.  She was diagnosed with influenza A infection.  She was treated with steroids and Tamiflu.  She started getting better.  She had a CT scan chest with contrast later on to rule out pulmonary embolism.  CTA did not show pulmonary embolism but showed mediastinal and hilar lymphadenopathy for which [facility].  She is not aware of any prior abnormality on her chest x-ray or CT scan chest.  She denies low-grade fever, night sweats, loss of appetite or weight loss.  No other lymphadenopathy.  Pulmonary consult was requested to help with further management.    Past Medical History  Asthma  Allergic rhinitis  Diabetes mellitus  GERD  Hypertension  Obesity  Obstructive sleep apnea    Past Surgical History  Cholecystectomy  Tubal ligation    Family History  She has strong family history of asthma.  No family history of sarcoidosis.    Social History  She was never a serious smoker.  She does not have any pets.  She denies use of alcohol or illicit drug use.  Social History     Tobacco Use   ??? Smoking status: Not on file   Substance Use Topics   ??? Alcohol use: Not on file   ??? Drug use: Not on file        Medications  She was using Advair, Singulair, albuterol inhaler and DuoNeb at home.  Prior to Admission medications    Not on File       Current Facility-Administered Medications   Medication Dose Route Frequency   ??? ondansetron (ZOFRAN) injection 4 mg  4 mg IntraVENous Q4H PRN    ??? albuterol (PROVENTIL VENTOLIN) nebulizer solution 2.5 mg  2.5 mg Nebulization Q2H PRN   ??? montelukast (SINGULAIR) tablet 10 mg  10 mg Oral QHS   ??? oseltamivir (TAMIFLU) capsule 75 mg  75 mg Oral BID   ??? sodium chloride (NS) flush 5-10 mL  5-10 mL IntraVENous Q8H   ??? sodium chloride (NS) flush 5-10 mL  5-10 mL IntraVENous PRN   ??? naloxone (NARCAN) injection 0.1 mg  0.1 mg IntraVENous PRN   ??? heparin (porcine) injection 5,000 Units  5,000 Units SubCUTAneous Q8H   ??? 0.9% sodium chloride infusion  75 mL/hr IntraVENous CONTINUOUS   ??? dextrose (D50) infusion 5-25 g  10-50 mL IntraVENous PRN   ??? glucagon (GLUCAGEN) injection 1 mg  1 mg IntraMUSCular PRN   ??? insulin glargine (LANTUS) injection 1-100 Units  1-100 Units SubCUTAneous QHS   ??? insulin lispro (HUMALOG) injection   SubCUTAneous AC&HS   ??? insulin lispro (HUMALOG) injection 1-100 Units  1-100 Units SubCUTAneous PRN   ??? fluticasone-vilanterol (BREO ELLIPTA) 231mcg-25mcg/puff  1 Puff Inhalation DAILY     No current outpatient medications on file.       Allergy  No Known Allergies    Review of Systems  A comprehensive review of system was performed and was otherwise negative except for as mentioned in HPI.    Physical Exam:   Vital Signs:    Visit Vitals  BP 120/78 (BP Patient Position: At rest)   Pulse 78   Temp 97.8 ??F (36.6 ??C)   Resp 18   Ht 5\' 4"  (1.626 m)   Wt 76.7 kg (169 lb)   SpO2 99%   BMI 29.01 kg/m??       O2 Device: Room air   O2 Flow Rate (L/min): 2 l/min   Temp (24hrs), Avg:98 ??F (36.7 ??C), Min:97.8 ??F (36.6 ??C), Max:98.2 ??F (36.8 ??C)       Intake/Output:   Last shift:      No intake/output data recorded.  Last 3 shifts: No intake/output data recorded.  No intake or output data in the 24 hours ending 06/12/17 0742     GENERAL: Awake, alert, comfortable  HEENT: Oral mucosa moist. No thrush.   NECK: Supple. Trachea midline.   RESPIRATORY: Bilateral BS present, bilateral rhonchi present.  CARDIOVASCULAR: S1 and S2 present.    ABDOMEN: Soft and nontender with positive bowel sounds. No organomegaly.   NEUROLOGICAL: Conscious, no focal weakness.      Labs Reviewed:  Recent Results (from the past 48  hour(s))   GLUCOSE, POC    Collection Time: 06/11/17  8:18 PM   Result Value Ref Range    Glucose (POC) 130 (H) 65 - 105 mg/dL   POTASSIUM    Collection Time: 06/12/17 12:25 AM   Result Value Ref Range    Potassium 2.8 (LL) 3.5 - 5.1 mEq/L   TROPONIN I    Collection Time: 06/12/17 12:25 AM   Result Value Ref Range    Troponin-I 0.023 0.000 - 0.045 ng/ml   TROPONIN I    Collection Time: 06/12/17  3:30 AM   Result Value Ref Range    Troponin-I <0.015 0.000 - 0.045 ng/ml   CBC W/O DIFF    Collection Time: 06/12/17  6:10 AM   Result Value Ref Range    WBC 9.0 4.0 - 11.0 1000/mm3    RBC 4.63 3.60 - 5.20 M/uL    HGB 10.3 (L) 13.0 - 17.2 gm/dl    HCT 60.4 (L) 54.0 - 50.0 %    MCV 74.3 (L) 80.0 - 98.0 fL    MCH 22.2 (L) 25.4 - 34.6 pg    MCHC 29.9 (L) 30.0 - 36.0 gm/dl    PLATELET 981 (H) 191 - 450 1000/mm3    MPV 9.3 6.0 - 10.0 fL    RDW-SD 47.4 (H) 36.4 - 46.3     METABOLIC PANEL, BASIC    Collection Time: 06/12/17  6:10 AM   Result Value Ref Range    Sodium 141 136 - 145 mEq/L    Potassium 3.3 (L) 3.5 - 5.1 mEq/L    Chloride 102 98 - 107 mEq/L    CO2 29 21 - 32 mEq/L    Glucose 81 74 - 106 mg/dl    BUN 7 7 - 25 mg/dl    Creatinine 0.5 (L) 0.6 - 1.3 mg/dl    GFR est AA >47.8      GFR est non-AA >60      Calcium 8.1 (L) 8.5 - 10.1 mg/dl    Anion gap 9 5 - 15 mmol/L   TROPONIN I    Collection Time: 06/12/17  6:10 AM   Result Value Ref Range    Troponin-I <0.015 0.000 - 0.045 ng/ml   GLUCOSE, POC    Collection Time: 06/12/17  6:13 AM   Result Value Ref Range    Glucose (POC) 73 65 - 105 mg/dL             Imaging:  I reviewed report of CT scan chest.  I was not able to review images myself.        Nanetta Batty, MD  [phone]      Dragon medical dictation software was used for portions of this report.   Unintended voice transcription errors may have occurred.

## 2017-06-12 NOTE — ED Notes (Signed)
Bedside shift change report given to Wendy RN (oncoming nurse) by Khyler Eschmann RN (offgoing nurse). Report included the following information SBAR.

## 2017-06-13 LAB — CBC WITH AUTOMATED DIFF
BASOPHILS: 0 % (ref 0–3)
EOSINOPHILS: 0.5 % (ref 0–5)
HCT: 29.7 % — ABNORMAL LOW (ref 37.0–50.0)
HGB: 9.5 gm/dl — ABNORMAL LOW (ref 13.0–17.2)
IMMATURE GRANULOCYTES: 0.7 % (ref 0.0–3.0)
LYMPHOCYTES: 32.9 % (ref 28–48)
MCH: 22.9 pg — ABNORMAL LOW (ref 25.4–34.6)
MCHC: 32 gm/dl (ref 30.0–36.0)
MCV: 71.7 fL — ABNORMAL LOW (ref 80.0–98.0)
MONOCYTES: 6.6 % (ref 1–13)
MPV: 9.1 fL (ref 6.0–10.0)
NEUTROPHILS: 59.3 % (ref 34–64)
NRBC: 0 (ref 0–0)
PLATELET: 602 10*3/uL — ABNORMAL HIGH (ref 140–450)
RBC: 4.14 M/uL (ref 3.60–5.20)
RDW-SD: 45.9 (ref 36.4–46.3)
WBC: 10.7 10*3/uL (ref 4.0–11.0)

## 2017-06-13 LAB — HEMOGLOBIN A1C W/O EAG: Hemoglobin A1c: 10.8 % — ABNORMAL HIGH (ref 4.2–6.3)

## 2017-06-13 LAB — METABOLIC PANEL, BASIC
Anion gap: 5 mmol/L (ref 5–15)
BUN: 13 mg/dl (ref 7–25)
CO2: 29 mEq/L (ref 21–32)
Calcium: 8.5 mg/dl (ref 8.5–10.1)
Chloride: 102 mEq/L (ref 98–107)
Creatinine: 0.7 mg/dl (ref 0.6–1.3)
GFR est AA: >60
GFR est non-AA: >60
Glucose: 193 mg/dl — ABNORMAL HIGH (ref 74–106)
Potassium: 3.1 mEq/L — ABNORMAL LOW (ref 3.5–5.1)
Sodium: 136 mEq/L (ref 136–145)

## 2017-06-13 LAB — GLUCOSE, POC
Glucose (POC): 346 mg/dL — ABNORMAL HIGH (ref 65–105)
Glucose (POC): 329 mg/dL — ABNORMAL HIGH (ref 65–105)
Glucose (POC): 211 mg/dL — ABNORMAL HIGH (ref 65–105)
Glucose (POC): 179 mg/dL — ABNORMAL HIGH (ref 65–105)

## 2017-06-13 MED ORDER — SODIUM CHLORIDE 0.9 % NEB SOLUTION
0.9 % | RESPIRATORY_TRACT | Status: DC
Start: 2017-06-13 — End: 2017-06-13

## 2017-06-13 MED ORDER — SODIUM CHLORIDE 0.9 % NEB SOLUTION
0.9 % | RESPIRATORY_TRACT | Status: AC
Start: 2017-06-13 — End: 2017-06-13
  Administered 2017-06-13: 14:00:00

## 2017-06-13 MED ORDER — MELATONIN 3 MG TAB
3 mg | Freq: Once | ORAL | Status: AC
Start: 2017-06-13 — End: 2017-06-13
  Administered 2017-06-13: 08:00:00 via ORAL

## 2017-06-13 MED ORDER — PREDNISONE 10 MG TAB
10 mg | ORAL_TABLET | ORAL | 0 refills | Status: AC
Start: 2017-06-13 — End: ?

## 2017-06-13 MED ORDER — FLU VACCINE QV 2018-19 (4 YRS+)(PF) 60 MCG (15 MCG X 4)/0.5 ML IM SYRINGE
60 mcg (15 mcg x 4)/0.5 mL | INTRAMUSCULAR | Status: DC
Start: 2017-06-13 — End: 2017-06-13

## 2017-06-13 MED ORDER — POTASSIUM CHLORIDE SR 20 MEQ TAB, PARTICLES/CRYSTALS
20 mEq | Freq: Once | ORAL | Status: AC
Start: 2017-06-13 — End: 2017-06-13
  Administered 2017-06-13: 18:00:00 via ORAL

## 2017-06-13 MED FILL — MELATONIN 3 MG TAB: 3 mg | ORAL | Qty: 1

## 2017-06-13 MED FILL — BD POSIFLUSH NORMAL SALINE 0.9 % INJECTION SYRINGE: INTRAMUSCULAR | Qty: 10

## 2017-06-13 MED FILL — OSELTAMIVIR PHOSPHATE 75 MG CAP: 75 mg | ORAL | Qty: 1

## 2017-06-13 MED FILL — ALBUTEROL SULFATE 2.5 MG/0.5 ML NEB SOLUTION: 2.5 mg/0.5 mL | RESPIRATORY_TRACT | Qty: 0.5

## 2017-06-13 MED FILL — SODIUM CHLORIDE 0.9 % NEB SOLUTION: 0.9 % | RESPIRATORY_TRACT | Qty: 3

## 2017-06-13 MED FILL — POTASSIUM CHLORIDE SR 20 MEQ TAB, PARTICLES/CRYSTALS: 20 mEq | ORAL | Qty: 3

## 2017-06-13 MED FILL — INSULIN LISPRO 100 UNIT/ML INJECTION: 100 unit/mL | SUBCUTANEOUS | Qty: 1

## 2017-06-13 MED FILL — PREDNISONE 10 MG TAB: 10 mg | ORAL | Qty: 1

## 2017-06-13 MED FILL — HEPARIN (PORCINE) 5,000 UNIT/ML IJ SOLN: 5000 unit/mL | INTRAMUSCULAR | Qty: 1

## 2017-06-13 MED FILL — BREO ELLIPTA 200 MCG-25 MCG/DOSE POWDER FOR INHALATION: 200-25 mcg/dose | RESPIRATORY_TRACT | Qty: 28

## 2017-06-13 MED FILL — INSULIN GLARGINE 100 UNIT/ML INJECTION: 100 unit/mL | SUBCUTANEOUS | Qty: 1

## 2017-06-13 MED FILL — MONTELUKAST 10 MG TAB: 10 mg | ORAL | Qty: 1

## 2017-06-13 NOTE — Discharge Summary (Signed)
Hospitalist Discharge Summary      Discharge Summary   Admit Date: 06/11/2017  Discharge Date:  06/13/17      Patient ID:  Jennifer Brock  47 y.o.  [birth year]    Chief Complaint   Patient presents with   ??? Respiratory Distress       Discharge Diagnoses  1. Acute asthma exacerbation: Aggravated by #2. Improved. Continue home meds. Prednisone taper.  She reports an adequate supply of medications at home including her home nebulizers.    2. Influenza A: To finish previously prescribed course of tamiflu    3. Mediastinal Lymphadenopathy: reported on CT from OSH as below-images unable to be viewed here.  Pulm has seen and plans for outpt follow up. Unable to undergo bronchoscopy at this time due to above.    4. DM2-uncontrolled with hyperglycemia: Steroids likely worsen but also A1c of 10.8. Diabetic education consult.  Would benefit from improved control as outpt.    5. Hypokalemia: K+ of 3.1 today. Replete PO today prior to discharge. Consider BMP at follow up.        Current Discharge Medication List      START taking these medications    Details   predniSONE (DELTASONE) 10 mg tablet Prednisone taper, 40mg  for one day, then 30mg  x1 day, then 20mg , then 10mg , then stop.  Qty: 10 Tab, Refills: 0         CONTINUE these medications which have NOT CHANGED    Details   zolpidem (AMBIEN) 5 mg tablet Take 5 mg by mouth nightly as needed for Sleep.      montelukast (SINGULAIR) 10 mg tablet Take 10 mg by mouth nightly.      fluticasone-salmeterol (ADVAIR DISKUS) 500-50 mcg/dose diskus inhaler Take 1 Puff by inhalation every twelve (12) hours.      albuterol (PROVENTIL HFA, VENTOLIN HFA, PROAIR HFA) 90 mcg/actuation inhaler Take 1 Puff by inhalation every four (4) hours as needed for Wheezing.      gabapentin (NEURONTIN) 100 mg capsule Take 100 mg by mouth nightly.      insulin detemir U-100 (LEVEMIR FLEXTOUCH) 100 unit/mL (3 mL) inpn 30 Units by SubCUTAneous route nightly.      linagliptin (TRADJENTA) 5 mg tablet Take 5 mg by  mouth daily.      furosemide (LASIX) 20 mg tablet Take 20 mg by mouth daily as needed.      albuterol-ipratropium (DUO-NEB) 2.5 mg-0.5 mg/3 ml nebu 3 mL by Nebulization route four (4) times daily.      magnesium oxide (MAG-OX) 400 mg tablet Take 400 mg by mouth daily.      oseltamivir (TAMIFLU) 75 mg capsule Take 75 mg by mouth two (2) times a day.      cetirizine (ZYRTEC) 10 mg tablet Take 10 mg by mouth daily.      amLODIPine (NORVASC) 10 mg tablet Take 10 mg by mouth daily.               Initial presentation:  From HPI: Jennifer Brock is a 47 y.o. BLACK OR [place] female who presents to outside hospital for shortness of breath and hyperglycemia  Was seen a few days before that for cough and was treated with steroids and cough medication and sent home but came back with worsening shortness of breath despite using duo nebs at home  She has history of asthma since a child and has been on inhaler for the same  Patient had evaluation as an inpatient done in  procedure with steroids, magnesium with improvement of symptoms and later had CT scan of the chest performed which showed adenopathy suggestive of possible lymphoma versus sarcoidosis  She was started on insulin for hyperglycemia and was started amlodipine for hypertension in addition to replacement of potassium for hypokalemia    she was admitted with plans for telemetry, to consult pulmonology, Glucomander, and continue treatment for influenza.    Hospital Course:   pulmonology evaluated noting image report from outside hospital with differential diagnosis of sarcoid, reactive lymphadenopathy, or less likely lymphoma. But felt that she was having acute exacerbation exacerbation with recent influenza infection cannot undergo bronchoscopy at this time. Plan for outpatient follow-up with CT scan of the chest and decide further workup based on those results. Recommended prednisone taper, to continue inhalers and nebulizers, and continue Flomax for total of 5 days  that she had already started treatment at outside facility.  the next day the patient reported feeling well enough to go home. Still some wheezes but much improved from arrival. She has no other new complaints. Diabetes is been poorly controlled, with A1c of 10.8. Steroids likely will help us acutely. However will defer to her glucose to outpatient care. She reports she [facility] not [facility] so we'll refer to the transitional care clinic. Follow up with pulmonology in one week. Continue previous the prescribed course of Tamiflu. She reports adequate supply of her inhaled medications at home.  Potassium was repleted prior to discharge. Follow-up with pulmonology as outpatient.    Consultants:  Dr. Mike CrazeHimanshu Desai of Pulmonology    Imaging:  CTA from OSH on 06/10/17:  IMPRESSION:    1. No acute pulmonary embolus is identified  2. Abnormal adenopathy as described. The differential would include   lymphoma, metastatic disease and granulomatous disease including   sarcoidosis.        Reading Doctor: Jeannine KittenPeat, Kenneth  Electronic Signature by: Jeannine KittenPeat, Kenneth   Result Narrative   CTA SCAN OF THE CHEST WITH CONTRAST    HISTORY:????Effusion and bradycardia    COMPARISON:????Chest x-ray 06/07/2017    TECHNIQUE:????Thin slice axial images were obtained with 100 cc's of   Omnipaque 350 contrast.????Multiplanar images were also obtained.????Maximum   intensity projection algorithms were utilized in all CTA studies.     FINDINGS:    Thoracic Aorta:????Normal sized thoracic aorta. No dissection.    Adenopathy:????There is a prominent lymph node near the aortopulmonary window   on the left side measuring 15 x 25 mm in size. There also some mediastinal   lymph nodes with a short axis of 12 mm. There are subcarinal lymph nodes   present. There are also small hilar lymph nodes and 1 left perihilar mass   or lymph node extending up to the left upper lobe measuring 12 x 14 mm.Marland Kitchen.    Heart:????Normal size heart. No pericardial  effusion.    Lungs:????Besides the left suprahilar density, no other masses, infiltrates,   pleural effusions or pneumothorax is evident..    Pulmonary arteries: No acute pulmonary most is identified.    Upper Abdomen:????Clips from a previous cholecystectomy.    Skeletal Structures:????No acute findings..         Physical Exam on Discharge:  Visit Vitals  BP (!) 129/92 (BP 1 Location: Left arm, BP Patient Position: Supine)   Pulse (!) 108   Temp 97.6 ??F (36.4 ??C)   Resp 14   Ht 5\' 4"  (1.626 m)   Wt 76.8 kg (169 lb  5 oz)   SpO2 95%   BMI 29.06 kg/m??       General: Alert, Well developed AAF, pleasant to conversation, in no acute distress  CV: Regular rate and rhythm, no murmurs, rubs, gallops  Pulm: Lungs with slight B wheeze. No crackles, rhonchi. normal work of breathing.  GI: Soft, nontender, nondistended, normal bowel sounds  Extremity: No clubbing, cyanosis, no pitting lower leg edema, capillary refill time intact to distal fingertips  Skin: Warm, dry    Most Recent BMP and CBC:    Lab Results   Component Value Date/Time    Sodium 136 06/13/2017 07:56 AM    Potassium 3.1 (L) 06/13/2017 07:56 AM    Chloride 102 06/13/2017 07:56 AM    CO2 29 06/13/2017 07:56 AM    Anion gap 5 06/13/2017 07:56 AM    Glucose 193 (H) 06/13/2017 07:56 AM    BUN 13 06/13/2017 07:56 AM    Creatinine 0.7 06/13/2017 07:56 AM    GFR est AA >60.0 06/13/2017 07:56 AM    GFR est non-AA >60 06/13/2017 07:56 AM    Calcium 8.5 06/13/2017 07:56 AM      Lab Results   Component Value Date/Time    WBC 10.7 06/13/2017 07:56 AM    HGB 9.5 (L) 06/13/2017 07:56 AM    HCT 29.7 (L) 06/13/2017 07:56 AM    PLATELET 602 (H) 06/13/2017 07:56 AM    MCV 71.7 (L) 06/13/2017 07:56 AM          Condition at discharge: Stable.     Disposition:  Home      PCP:  Reports does not currently have a PCP-f/up with Transitional care clinic     F/up with Pulm: Dr. Mike Craze in one week.      Total time for DC was 34 minutes.      Scarlette Ar, MD  June 13, 2017  13:44

## 2017-06-13 NOTE — Progress Notes (Signed)
PAGER ID: [phone]   MESSAGE: Hi. [id number], Jennifer Brock, Dx: Influenza A, is asking for ambien 5 mg to make her sleep. Please advise.. Thank you. Marjie SkiffJeffrey Prado RN. [address] [phone]

## 2017-06-13 NOTE — Discharge Summary (Signed)
Hospitalist Discharge Summary      Discharge Summary   Admit Date: 06/11/2017  Discharge Date:  06/13/17      Patient ID:  Jennifer Brock  47 y.o.  [birth year]    Chief Complaint   Patient presents with   ??? Respiratory Distress       Discharge Diagnoses  1. Acute asthma exacerbation: Aggravated by #2. Improved. Continue home meds. Prednisone taper.  She reports an adequate supply of medications at home including her home nebulizers.    2. Influenza A: To finish previously prescribed course of tamiflu    3. Mediastinal Lymphadenopathy: reported on CT from OSH as below-images unable to be viewed here.  Pulm has seen and plans for outpt follow up. Unable to undergo bronchoscopy at this time due to above.    4. DM2-uncontrolled with hyperglycemia: Steroids likely worsen but also A1c of 10.8. Diabetic education consult.  Would benefit from improved control as outpt.    5. Hypokalemia: K+ of 3.1 today. Replete PO today prior to discharge. Consider BMP at follow up.        Current Discharge Medication List      START taking these medications    Details   predniSONE (DELTASONE) 10 mg tablet Prednisone taper, 40mg  for one day, then 30mg  x1 day, then 20mg , then 10mg , then stop.  Qty: 10 Tab, Refills: 0         CONTINUE these medications which have NOT CHANGED    Details   zolpidem (AMBIEN) 5 mg tablet Take 5 mg by mouth nightly as needed for Sleep.      montelukast (SINGULAIR) 10 mg tablet Take 10 mg by mouth nightly.      fluticasone-salmeterol (ADVAIR DISKUS) 500-50 mcg/dose diskus inhaler Take 1 Puff by inhalation every twelve (12) hours.      albuterol (PROVENTIL HFA, VENTOLIN HFA, PROAIR HFA) 90 mcg/actuation inhaler Take 1 Puff by inhalation every four (4) hours as needed for Wheezing.      gabapentin (NEURONTIN) 100 mg capsule Take 100 mg by mouth nightly.      insulin detemir U-100 (LEVEMIR FLEXTOUCH) 100 unit/mL (3 mL) inpn 30 Units by SubCUTAneous route nightly.       linagliptin (TRADJENTA) 5 mg tablet Take 5 mg by mouth daily.      furosemide (LASIX) 20 mg tablet Take 20 mg by mouth daily as needed.      albuterol-ipratropium (DUO-NEB) 2.5 mg-0.5 mg/3 ml nebu 3 mL by Nebulization route four (4) times daily.      magnesium oxide (MAG-OX) 400 mg tablet Take 400 mg by mouth daily.      oseltamivir (TAMIFLU) 75 mg capsule Take 75 mg by mouth two (2) times a day.      cetirizine (ZYRTEC) 10 mg tablet Take 10 mg by mouth daily.      amLODIPine (NORVASC) 10 mg tablet Take 10 mg by mouth daily.               Initial presentation:  From HPI: Jennifer Brock is a 47 y.o. BLACK OR [place] female who presents to outside hospital for shortness of breath and hyperglycemia  Was seen a few days before that for cough and was treated with steroids and cough medication and sent home but came back with worsening shortness of breath despite using duo nebs at home  She has history of asthma since a child and has been on inhaler for the same  Patient had evaluation as an inpatient done in  procedure with steroids, magnesium with improvement of symptoms and later had CT scan of the chest performed which showed adenopathy suggestive of possible lymphoma versus sarcoidosis  She was started on insulin for hyperglycemia and was started amlodipine for hypertension in addition to replacement of potassium for hypokalemia    she was admitted with plans for telemetry, to consult pulmonology, Glucomander, and continue treatment for influenza.    Hospital Course:   pulmonology evaluated noting image report from outside hospital with differential diagnosis of sarcoid, reactive lymphadenopathy, or less likely lymphoma. But felt that she was having acute exacerbation exacerbation with recent influenza infection cannot undergo bronchoscopy at this time. Plan for outpatient follow-up with CT scan of the chest and decide further workup based on those results. Recommended prednisone  taper, to continue inhalers and nebulizers, and continue Flomax for total of 5 days that she had already started treatment at outside facility.  the next day the patient reported feeling well enough to go home. Still some wheezes but much improved from arrival. She has no other new complaints. Diabetes is been poorly controlled, with A1c of 10.8. Steroids likely will help [place] acutely. However will defer to her glucose to outpatient care. She reports she does not currently have a provider home at [facility] so we'll refer to the transitional care clinic. Follow up with pulmonology in one week. Continue previous the prescribed course of Tamiflu. She reports adequate supply of her inhaled medications at home.  Potassium was repleted prior to discharge. Follow-up with pulmonology as outpatient.    Consultants:  Dr. Mike Craze of Pulmonology  Imaging:  CTA from OSH on 06/10/17:  IMPRESSION:    1. No acute pulmonary embolus is identified  2. Abnormal adenopathy as described. The differential would include   lymphoma, metastatic disease and granulomatous disease including   sarcoidosis.        Reading Doctor: Jeannine Kitten  Electronic Signature by: Jeannine Kitten   Result Narrative   CTA SCAN OF THE CHEST WITH CONTRAST    HISTORY:????Effusion and bradycardia    COMPARISON:????Chest x-ray 06/07/2017    TECHNIQUE:????Thin slice axial images were obtained with 100 cc's of   Omnipaque 350 contrast.????Multiplanar images were also obtained.????Maximum   intensity projection algorithms were utilized in all CTA studies.     FINDINGS:    Thoracic Aorta:????Normal sized thoracic aorta. No dissection.    Adenopathy:????There is a prominent lymph node near the aortopulmonary window   on the left side measuring 15 x 25 mm in size. There also some mediastinal   lymph nodes with a short axis of 12 mm. There are subcarinal lymph nodes   present. There are also small hilar lymph nodes and 1 left perihilar mass    or lymph node extending up to the left upper lobe measuring 12 x 14 mm.Marland Kitchen    Heart:????Normal size heart. No pericardial effusion.    Lungs:????Besides the left suprahilar density, no other masses, infiltrates,   pleural effusions or pneumothorax is evident..    Pulmonary arteries: No acute pulmonary most is identified.    Upper Abdomen:????Clips from a previous cholecystectomy.    Skeletal Structures:????No acute findings..       Physical Exam on Discharge:  Visit Vitals  BP (!) 129/92 (BP 1 Location: Left arm, BP Patient Position: Supine)   Pulse (!) 108   Temp 97.6 ??F (36.4 ??C)   Resp 14   Ht 5\' 4"  (1.626 m)   Wt 76.8 kg (169 lb 5 oz)  SpO2 95%   BMI 29.06 kg/m??       General: Alert, Well developed AAF, pleasant to conversation, in no acute distress  CV: Regular rate and rhythm, no murmurs, rubs, gallops  Pulm: Lungs with slight B wheeze. No crackles, rhonchi. normal work of breathing.  GI: Soft, nontender, nondistended, normal bowel sounds  Extremity: No clubbing, cyanosis, no pitting lower leg edema, capillary refill time intact to distal fingertips  Skin: Warm, dry    Most Recent BMP and CBC:    Lab Results   Component Value Date/Time    Sodium 136 06/13/2017 07:56 AM    Potassium 3.1 (L) 06/13/2017 07:56 AM    Chloride 102 06/13/2017 07:56 AM    CO2 29 06/13/2017 07:56 AM    Anion gap 5 06/13/2017 07:56 AM    Glucose 193 (H) 06/13/2017 07:56 AM    BUN 13 06/13/2017 07:56 AM    Creatinine 0.7 06/13/2017 07:56 AM    GFR est AA >60.0 06/13/2017 07:56 AM    GFR est non-AA >60 06/13/2017 07:56 AM    Calcium 8.5 06/13/2017 07:56 AM      Lab Results   Component Value Date/Time    WBC 10.7 06/13/2017 07:56 AM    HGB 9.5 (L) 06/13/2017 07:56 AM    HCT 29.7 (L) 06/13/2017 07:56 AM    PLATELET 602 (H) 06/13/2017 07:56 AM    MCV 71.7 (L) 06/13/2017 07:56 AM          Condition at discharge: Stable.     Disposition:  Home      PCP:  Reports does not currently have a PCP-f/up with Transitional care clinic      F/up with Pulm: Dr. Mike CrazeHimanshu Desai in one week.      Total time for DC was 34 minutes.      Scarlette ArJoseph Kealohilani Maiorino, MD  June 13, 2017  13:44

## 2017-06-13 NOTE — Other (Signed)
Bedside shift change report given to Shericka Adams, LPN (oncoming nurse) by Prado Jeffery RN (offgoing nurse). Report included the following information SBAR, Kardex, Intake/Output, MAR, Recent Results, Med Rec Status and Cardiac Rhythm SR.

## 2017-06-13 NOTE — Progress Notes (Signed)
Discharge Plan:   home    Discharge Date:     06/13/2017     TCC Referral:     yes    Transportation: Family will transport.

## 2017-06-13 NOTE — Other (Signed)
CIBR  Collaborative Interdisciplinary Bedside Report     Recommendations:       Estimated Discharge date: 06/16/2017     Discharge Location: Discharge Placement: Home    Mode of Transporation: Mode of Transportation: Wheelchair    TCC Referral:   If Yes ??? has it been completed?: N/A  If no - has it been ordered?: N/A       Case Manager assessment needs for discharge:  Prior Functional Level: Independent in ADLs/IADLs  Current Functional Level: Independent in ADLs/IADLs           Advance Directives:  Living Will on file?     Advance Care Planning 06/13/2017   Confirm Advance Directive None    Not Received    Medical Power of Attorney:    Surrogate Decision Maker:   Comments:     Confirm Advance Directive: None                    Overnight Events: na    Barriers: na    Consults to be completed:  Professional consults: Pulmonary/Critical Care     Ancillary Consults: na    Pending Procedures/Test: (X-Ray, US, PVL, ECHO)    Readmit:  Admits last 6 Months:_______ ED Visits/12 Months:_________  Assessment:  Relationship with Primary Physician Group: Seen at least one time within the past 6 months   History of Falls Within Past 3 Months: No   Needs Assistance with Wound Care AND/OR Mgnt of O2, Nebulizer: No   Requires Financial, Physical and/or Educational Assistance With Medications: No   History of Mental Illness: No   Living Alone: Yes     Readmit Risk Tool:  Readmit Risk Tool  Support Systems: Other (comments)  Relationship with Primary Physician Group: Seen at least one time within the past 6 months    - tool score    Readmit within 30 days:       Readmit to the ED:                 Patient: Jennifer Brock             MRN # [id number]               DOB: 1971-03-31  Unit:  6701/6701  Admission Date: 06/11/2017                   Length of Stay: 2 day(s)  Admission Dx:   1. Influenza A    2. Abnormal CT of the chest        Payor: Payor: [facility] / Plan: [facility] A & B / Product Type: Medicare /       Attending Provider:   Scarlette Aramarato, Joseph, MD  Primary Care Physician:Other, Phys, MD None    Treatment Team:   Treatment Team: Attending Provider: Scarlette Aramarato, Joseph, MD; Consulting Provider: Nanetta Battyesai, Himanshu D, MD; Consulting Provider: Scarlette Aramarato, Joseph, MD     VITAL SIGNS:  Vitals:    06/13/17 0153 06/13/17 0315 06/13/17 0752 06/13/17 0851   BP:  128/83 132/86    Pulse:  87 90    Resp:  18 12    Temp:  98 ??F (36.7 ??C) 98.4 ??F (36.9 ??C)    SpO2: 99% 96% 94% 93%   Weight:       Height:         Body mass index is 29.06 kg/m??. Body mass index is 29.06 kg/m??.    Weight:  Weight: 76.8 kg (169 lb 5 oz)  Weight Source: Weight Source: Bed    OXYGEN: (if applicable)  O2 Flow Rate (L/min): 2 l/min        Intake and Output:   No intake or output data in the 24 hours ending 06/13/17 0911        Current Diet:                                                         Active Orders   Diet    DIET DIABETIC WITH OPTIONS 4 Portions, Med; Regular                                Recent Glucose Results:   Lab Results   Component Value Date/Time    GLU 193 (H) 06/13/2017 07:56 AM    GLUCPOC 211 (H) 06/13/2017 08:01 AM    GLUCPOC 329 (H) 06/12/2017 10:55 PM    GLUCPOC 346 (H) 06/12/2017 09:46 PM       Glycemic Protocol needed:   N/A        Activity Level:  Activity Level: [employer], Up ad lib    Needs assistance with ADLs: no           VTE Prophylaxis:   Mechanical: (if applicable)  Sequential Compression Device: Bilateral     Medication: (if applicable)  Anticoagulant Meds (From now, onward)    Ordered     Dose Route Frequency Start Stop    06/11/17 2121  heparin (porcine) injection 5,000 Units      5,000 Units SC EVERY 8 HOURS 06/11/17 2200 --            IV Antibiotics:  Inpat Anti-Infectives (From admission, onward)    None           Change to PO:  Yes, No     Wounds:     Lines/Drains/Airway:     Peripheral IV Right Antecubital (Active)   Site Assessment Clean, dry, & intact 06/13/2017  1:27 AM    Dressing Status Clean, dry, & intact 06/13/2017  1:27 AM   Dressing Type Other (comments);Transparent;Tape 06/13/2017  1:27 AM   Hub Color/Line Status Blue 06/13/2017  1:27 AM       Peripheral IV Posterior;Right Hand (Active)   Site Assessment Clean, dry, & intact 06/13/2017  1:28 AM   Dressing Status Clean, dry, & intact 06/13/2017  1:28 AM   Dressing Type Transparent;Tape 06/13/2017  1:28 AM   Hub Color/Line Status Blue 06/13/2017  1:28 AM   Alcohol Cap Used Yes 06/13/2017  1:28 AM                                                               LABS: (Only Abnormal Labs last 24 hours will be pulled in)  CBC:   Lab Results   Component Value Date/Time    WBC 10.7 06/13/2017 07:56 AM    HGB 9.5 (L) 06/13/2017 07:56 AM    HCT 29.7 (L) 06/13/2017 07:56 AM  PLT 602 (H) 06/13/2017 07:56 AM      Lab Results   Component Value Date/Time    NA 136 06/13/2017 07:56 AM    K 3.1 (L) 06/13/2017 07:56 AM    CL 102 06/13/2017 07:56 AM    CO2 29 06/13/2017 07:56 AM    AGAP 5 06/13/2017 07:56 AM    GLU 193 (H) 06/13/2017 07:56 AM    BUN 13 06/13/2017 07:56 AM    CREA 0.7 06/13/2017 07:56 AM    GFRAA >60.0 06/13/2017 07:56 AM    GFRNA >60 06/13/2017 07:56 AM    CA 8.5 06/13/2017 07:56 AM        Goals of the Day: Ambulate, Up for meals, Out of bed.

## 2017-06-13 NOTE — Other (Addendum)
----------  DocumentID: [id number]------------------------------------------------              [facility]                       Patient Education Report         Name: Jennifer Brock, Jennifer Brock                  Date: 06/13/2017    MRN: [id number]                    Time: 1:37:37 AM         Patient ordered video: 'Pain Management: It's Your Right'    from [email or web address] via phone number: [phone] at 1:37:37 AM    Description: This video details patients' rights about pain management, discussing both chronic and acute pain, showing patients examples of how to communicate their pain level and why that is important.    ----------DocumentID: [id number]------------------------------------------------                       [facility]          Patient Education Report - Discharge Summary        Date: 06/13/2017   Time: 5:21:25 PM   Name: Jennifer Brock, Jennifer Brock   MRN: [id number]      Account Number: [id number]      Education History:        Patient ordered video: 'Pain Management: It's Your Right' from [id number] on 06/13/2017 01:37:37 AM

## 2017-06-13 NOTE — Progress Notes (Signed)
Patient admitted on 06/11/2017 from home with   Chief Complaint   Patient presents with   ??? Respiratory Distress        The patient has been admitted to the hospital 0 times in the past 12 months.    Tentative dc plan:    home    Anticipated Discharge Date:   06/16/2017    PCP: Dr. Karie MainlandAli    Face sheet information, address, contact info and insurance verified yes    Ph[facility]:     [facility]  Any issues getting medications  no    DME at home and provider:    Glucometer;Nebulizer    Home Environment:    Lives at [address]  [place] [address]    [email or web address].     Extended Emergency Contact Information  Primary Emergency Contact: MORING,CAROL  Home Phone: [phone]  Relation: Mother  Secondary Emergency Contact: Andrews,Twqwana  Home Phone: [phone]  Relation: None      Transportation:     Family will transport home    Therapy Recommendations: na    Case Management Assessment    ABUSE/NEGLECT SCREENING   Physical Abuse/Neglect: Denies   Sexual Abuse: Denies   Sexual Abuse: Denies   Other Abuse/Issues: Denies          PRIMARY DECISION MAKER                                   CARE MANAGEMENT INTERVENTIONS       PCP Verified by CM: Yes(Dr. Karie MainlandAli)                                                   Current Support Network: Lives Alone   Reason for Referral: DCP Rounds   History Provided By: Patient   Patient Orientation: Alert and Oriented   Cognition: Alert   Support System Response: Concerned   Previous Living Arrangement: Lives Alone Independent       Prior Functional Level: Independent in ADLs/IADLs   Current Functional Level: Independent in ADLs/IADLs   Primary Language: English   Can patient return to prior living arrangement: Yes   Ability to make needs known:: Good   Family able to assist with home care needs:: Yes                       Confirm Follow Up Transport: Family       Plan discussed with Pt/Family/Caregiver: Yes          DISCHARGE LOCATION   Discharge Placement: Home

## 2017-06-13 NOTE — Progress Notes (Signed)
[facility]      Progress Note     Name: Jennifer Brock   DOB: [birth year]   MRN: [id number]   Date: 06/13/2017    [x] I have reviewed the flowsheet and previous day???s notes. Events, vitals, medications and notes from last 24 hours reviewed.     ASSESSMENT:     -Influenza A infection  -Asthma exacerbation: Likely due to influenza infection  -History of moderate persistent asthma and allergic rhinitis  -Mild hilar and mediastinal lymphadenopathy on CT scan chest  -Type 2 diabetes mellitus: Uncontrolled  -Hypertension    PLAN:      -She is currently on room air.  -Continue prednisone taper.  -Continue Tamiflu.  -Continue Breo Ellipta 1 puff daily.  -Albuterol as needed.  -Continue Singulair.  -Diabetes management per Glucomander protocol.  -Heparin SQ for DVT prophylaxis  -She should be discharged home on Breo Ellipta 1 puff daily, Singulair daily, prednisone taper and albuterol as needed.  ??  I will see her in office on Monday at 12:45 PM with CD of her CT scan.  Based on my review of her CT scan, I will consider further workup including bronchoscopy or radiological follow-up.  Explained assessment and plan to the patient and family and answered questions.   Discussed with Dr. Jerl Mina.   No objection against discharge home with outpatient follow-up with me next week.  I will sign off for now.  Please call with questions.  >50% of 37 minutes spent in counseling and co-ordination of care.               SUBJECTIVE:  Sitting in bed, feels better.  Still has some cough and shortness of breath.  Denies fever.  No chest pain.  No nausea, vomiting or diarrhea.    ROS:   Otherwise negative except for as mentioned in subjective.     Allergy:  No Known Allergies     Vital Signs:    Visit Vitals  BP 127/87 (BP 1 Location: Left arm, BP Patient Position: Supine)   Pulse (!) 112   Temp 98.8 ??F (37.1 ??C)   Resp 13   Ht 5\' 4"  (1.626 m)   Wt 76.8 kg (169 lb 5 oz)   SpO2 93%   BMI 29.06 kg/m??        O2 Device: Room air   O2 Flow Rate (L/min): 2 l/min   Temp (24hrs), Avg:98 ??F (36.7 ??C), Min:97.6 ??F (36.4 ??C), Max:98.8 ??F (37.1 ??C)       Patient Vitals for the past 8 hrs:   Temp Pulse Resp BP SpO2   06/13/17 1505 98.8 ??F (37.1 ??C) (!) 112 13 127/87 93 %   06/13/17 1330 ??? ??? ??? ??? 95 %   06/13/17 1058 97.6 ??F (36.4 ??C) (!) 108 14 (!) 129/92 96 %   06/13/17 0851 ??? ??? ??? ??? 93 %       Intake/Output:     Last shift:      02/22 0701 - 02/22 1900  In: 960 [P.O.:960]  Out: -   Last 3 shifts: No intake/output data recorded.    Intake/Output Summary (Last 24 hours) at 06/13/2017 1604  Last data filed at 06/13/2017 1303  Gross per 24 hour   Intake 960 ml   Output ???   Net 960 ml      Physical Exam:    GENERAL: Awake, alert, comfortable  HEENT: Oral mucosa moist. No thrush.   NECK: Supple.  Trachea midline.   RESPIRATORY: Bilateral BS present, bilateral rhonchi present.  CARDIOVASCULAR: S1 and S2 present.   ABDOMEN: Soft and nontender with positive bowel sounds. No organomegaly.   NEUROLOGICAL: Conscious, no focal weakness.      DATA:     Current Facility-Administered Medications   Medication Dose Route Frequency   ??? influenza vaccine 2018-19 (4 yrs+)(PF) (FLUCELVAX QUAD) injection 0.5 mL  0.5 mL IntraMUSCular PRIOR TO DISCHARGE   ??? sodium chloride 0.9 % nebu       ??? [START ON 06/14/2017] predniSONE (DELTASONE) tablet 40 mg  40 mg Oral ONCE    Followed by   ??? [START ON 06/15/2017] predniSONE (DELTASONE) tablet 30 mg  30 mg Oral ONCE    Followed by   ??? [START ON 06/16/2017] predniSONE (DELTASONE) tablet 20 mg  20 mg Oral ONCE    Followed by   ??? [START ON 06/17/2017] predniSONE (DELTASONE) tablet 10 mg  10 mg Oral ONCE   ??? albuterol CONCENTRATE 2.5mg /0.5 mL neb soln  2.5 mg Nebulization Q6H RT   ??? ondansetron (ZOFRAN) injection 4 mg  4 mg IntraVENous Q4H PRN   ??? albuterol (PROVENTIL VENTOLIN) nebulizer solution 2.5 mg  2.5 mg Nebulization Q2H PRN   ??? montelukast (SINGULAIR) tablet 10 mg  10 mg Oral QHS    ??? oseltamivir (TAMIFLU) capsule 75 mg  75 mg Oral BID   ??? sodium chloride (NS) flush 5-10 mL  5-10 mL IntraVENous Q8H   ??? sodium chloride (NS) flush 5-10 mL  5-10 mL IntraVENous PRN   ??? naloxone (NARCAN) injection 0.1 mg  0.1 mg IntraVENous PRN   ??? heparin (porcine) injection 5,000 Units  5,000 Units SubCUTAneous Q8H   ??? dextrose (D50) infusion 5-25 g  10-50 mL IntraVENous PRN   ??? glucagon (GLUCAGEN) injection 1 mg  1 mg IntraMUSCular PRN   ??? insulin glargine (LANTUS) injection 1-100 Units  1-100 Units SubCUTAneous QHS   ??? insulin lispro (HUMALOG) injection   SubCUTAneous AC&HS   ??? insulin lispro (HUMALOG) injection 1-100 Units  1-100 Units SubCUTAneous PRN   ??? fluticasone-vilanterol (BREO ELLIPTA) 266mcg-25mcg/puff  1 Puff Inhalation DAILY             Labs:  Reviewed.   Recent Results (from the past 24 hour(s))   GLUCOSE, POC    Collection Time: 06/12/17  5:50 PM   Result Value Ref Range    Glucose (POC) 382 (H) 65 - 105 mg/dL   GLUCOSE, POC    Collection Time: 06/12/17  9:46 PM   Result Value Ref Range    Glucose (POC) 346 (H) 65 - 105 mg/dL   GLUCOSE, POC    Collection Time: 06/12/17 10:55 PM   Result Value Ref Range    Glucose (POC) 329 (H) 65 - 105 mg/dL   METABOLIC PANEL, BASIC    Collection Time: 06/13/17  7:56 AM   Result Value Ref Range    Sodium 136 136 - 145 mEq/L    Potassium 3.1 (L) 3.5 - 5.1 mEq/L    Chloride 102 98 - 107 mEq/L    CO2 29 21 - 32 mEq/L    Glucose 193 (H) 74 - 106 mg/dl    BUN 13 7 - 25 mg/dl    Creatinine 0.7 0.6 - 1.3 mg/dl    GFR est AA >16.1      GFR est non-AA >60      Calcium 8.5 8.5 - 10.1 mg/dl    Anion gap 5  5 - 15 mmol/L   CBC WITH AUTOMATED DIFF    Collection Time: 06/13/17  7:56 AM   Result Value Ref Range    WBC 10.7 4.0 - 11.0 1000/mm3    RBC 4.14 3.60 - 5.20 M/uL    HGB 9.5 (L) 13.0 - 17.2 gm/dl    HCT 16.129.7 (L) 09.637.0 - 50.0 %    MCV 71.7 (L) 80.0 - 98.0 fL    MCH 22.9 (L) 25.4 - 34.6 pg    MCHC 32.0 30.0 - 36.0 gm/dl    PLATELET 045602 (H) 409140 - 450 1000/mm3     MPV 9.1 6.0 - 10.0 fL    RDW-SD 45.9 36.4 - 46.3      NRBC 0 0 - 0      IMMATURE GRANULOCYTES 0.7 0.0 - 3.0 %    NEUTROPHILS 59.3 34 - 64 %    LYMPHOCYTES 32.9 28 - 48 %    MONOCYTES 6.6 1 - 13 %    EOSINOPHILS 0.5 0 - 5 %    BASOPHILS 0.0 0 - 3 %   HEMOGLOBIN A1C W/O EAG    Collection Time: 06/13/17  7:56 AM   Result Value Ref Range    Hemoglobin A1c 10.8 (H) 4.2 - 6.3 %   GLUCOSE, POC    Collection Time: 06/13/17  8:01 AM   Result Value Ref Range    Glucose (POC) 211 (H) 65 - 105 mg/dL   GLUCOSE, POC    Collection Time: 06/13/17 12:11 PM   Result Value Ref Range    Glucose (POC) 179 (H) 65 - 105 mg/dL         Imaging:  No new imaging to review.        Nanetta BattyHimanshu D Johncharles Fusselman, MD

## 2019-11-30 ENCOUNTER — Emergency Department (HOSPITAL_COMMUNITY): Payer: Medicare Other

## 2019-11-30 ENCOUNTER — Inpatient Hospital Stay (HOSPITAL_COMMUNITY)
Admission: EM | Admit: 2019-11-30 | Discharge: 2019-12-03 | DRG: 638 | Disposition: A | Discharge status: Home / Self Care | Payer: Medicare Other | Attending: Internal Medicine | Admitting: Internal Medicine

## 2019-11-30 ENCOUNTER — Encounter (HOSPITAL_COMMUNITY): Payer: Self-pay | Admitting: Emergency Medicine

## 2019-11-30 ENCOUNTER — Other Ambulatory Visit: Payer: Self-pay

## 2019-11-30 DIAGNOSIS — Z825 Family history of asthma and other chronic lower respiratory diseases: Secondary | ICD-10-CM | POA: Diagnosis not present

## 2019-11-30 DIAGNOSIS — E871 Hypo-osmolality and hyponatremia: Secondary | ICD-10-CM | POA: Diagnosis present

## 2019-11-30 DIAGNOSIS — Z20822 Contact with and (suspected) exposure to covid-19: Secondary | ICD-10-CM | POA: Diagnosis present

## 2019-11-30 DIAGNOSIS — Z7951 Long term (current) use of inhaled steroids: Secondary | ICD-10-CM

## 2019-11-30 DIAGNOSIS — Z9049 Acquired absence of other specified parts of digestive tract: Secondary | ICD-10-CM

## 2019-11-30 DIAGNOSIS — Z79899 Other long term (current) drug therapy: Secondary | ICD-10-CM

## 2019-11-30 DIAGNOSIS — K219 Gastro-esophageal reflux disease without esophagitis: Secondary | ICD-10-CM | POA: Diagnosis present

## 2019-11-30 DIAGNOSIS — I10 Essential (primary) hypertension: Secondary | ICD-10-CM | POA: Diagnosis present

## 2019-11-30 DIAGNOSIS — Z7984 Long term (current) use of oral hypoglycemic drugs: Secondary | ICD-10-CM | POA: Diagnosis not present

## 2019-11-30 DIAGNOSIS — J45909 Unspecified asthma, uncomplicated: Secondary | ICD-10-CM | POA: Diagnosis present

## 2019-11-30 DIAGNOSIS — E860 Dehydration: Secondary | ICD-10-CM | POA: Diagnosis present

## 2019-11-30 DIAGNOSIS — R6510 Systemic inflammatory response syndrome (SIRS) of non-infectious origin without acute organ dysfunction: Secondary | ICD-10-CM | POA: Diagnosis present

## 2019-11-30 DIAGNOSIS — Z22322 Carrier or suspected carrier of Methicillin resistant Staphylococcus aureus: Secondary | ICD-10-CM

## 2019-11-30 DIAGNOSIS — N289 Disorder of kidney and ureter, unspecified: Secondary | ICD-10-CM | POA: Diagnosis not present

## 2019-11-30 DIAGNOSIS — Z793 Long term (current) use of hormonal contraceptives: Secondary | ICD-10-CM

## 2019-11-30 DIAGNOSIS — E1110 Type 2 diabetes mellitus with ketoacidosis without coma: Secondary | ICD-10-CM | POA: Diagnosis present

## 2019-11-30 DIAGNOSIS — N179 Acute kidney failure, unspecified: Secondary | ICD-10-CM | POA: Diagnosis present

## 2019-11-30 DIAGNOSIS — R0602 Shortness of breath: Secondary | ICD-10-CM

## 2019-11-30 HISTORY — DX: Anemia, unspecified: D64.9

## 2019-11-30 HISTORY — DX: Type 2 diabetes mellitus without complications: E11.9

## 2019-11-30 HISTORY — DX: Unspecified asthma, uncomplicated: J45.909

## 2019-11-30 LAB — CBC WITH DIFFERENTIAL/PLATELET
Abs Immature Granulocytes: 0.07 10*3/uL (ref 0.00–0.07)
Basophils Absolute: 0.1 10*3/uL (ref 0.0–0.1)
Basophils Relative: 0 %
Eosinophils Absolute: 0 10*3/uL (ref 0.0–0.5)
Eosinophils Relative: 0 %
HCT: 41.4 % (ref 36.0–46.0)
Hemoglobin: 13.3 g/dL (ref 12.0–15.0)
Immature Granulocytes: 1 %
Lymphocytes Relative: 12 %
Lymphs Abs: 1.7 10*3/uL (ref 0.7–4.0)
MCH: 28.2 pg (ref 26.0–34.0)
MCHC: 32.1 g/dL (ref 30.0–36.0)
MCV: 87.7 fL (ref 80.0–100.0)
Monocytes Absolute: 0.9 10*3/uL (ref 0.1–1.0)
Monocytes Relative: 7 %
Neutro Abs: 10.8 10*3/uL — ABNORMAL HIGH (ref 1.7–7.7)
Neutrophils Relative %: 80 %
Platelets: 786 10*3/uL — ABNORMAL HIGH (ref 150–400)
RBC: 4.72 MIL/uL (ref 3.87–5.11)
RDW: 13.9 % (ref 11.5–15.5)
WBC: 13.5 10*3/uL — ABNORMAL HIGH (ref 4.0–10.5)
nRBC: 0 % (ref 0.0–0.2)

## 2019-11-30 LAB — URINALYSIS, ROUTINE W REFLEX MICROSCOPIC
Bacteria, UA: NONE SEEN
Bilirubin Urine: NEGATIVE
Glucose, UA: >=500 mg/dL — AB
Ketones, ur: 80 mg/dL — AB
Nitrite: NEGATIVE
Protein, ur: 30 mg/dL — AB
Specific Gravity, Urine: 1.016 (ref 1.005–1.030)
pH: 5 (ref 5.0–8.0)

## 2019-11-30 LAB — COMPREHENSIVE METABOLIC PANEL
ALT: 12 U/L (ref 0–44)
AST: 9 U/L — ABNORMAL LOW (ref 15–41)
Albumin: 4.7 g/dL (ref 3.5–5.0)
Alkaline Phosphatase: 93 U/L (ref 38–126)
Anion gap: 34 — ABNORMAL HIGH (ref 5–15)
BUN: 20 mg/dL (ref 6–20)
CO2: 11 mmol/L — ABNORMAL LOW (ref 22–32)
Calcium: 10.4 mg/dL — ABNORMAL HIGH (ref 8.9–10.3)
Chloride: 88 mmol/L — ABNORMAL LOW (ref 98–111)
Creatinine, Ser: 1.78 mg/dL — ABNORMAL HIGH (ref 0.44–1.00)
GFR calc Af Amer: 39 mL/min — ABNORMAL LOW (ref >60)
GFR calc non Af Amer: 34 mL/min — ABNORMAL LOW (ref >60)
Glucose, Bld: 713 mg/dL (ref 70–99)
Potassium: 4.5 mmol/L (ref 3.5–5.1)
Sodium: 133 mmol/L — ABNORMAL LOW (ref 135–145)
Total Bilirubin: 1.8 mg/dL — ABNORMAL HIGH (ref 0.3–1.2)
Total Protein: 10.1 g/dL — ABNORMAL HIGH (ref 6.5–8.1)

## 2019-11-30 LAB — BLOOD GAS, VENOUS
Acid-base deficit: 23.4 mmol/L — ABNORMAL HIGH (ref 0.0–2.0)
Bicarbonate: 6.7 mmol/L — ABNORMAL LOW (ref 20.0–28.0)
O2 Saturation: 41.9 %
Patient temperature: 98.6
pCO2, Ven: 25.5 mmHg — ABNORMAL LOW (ref 44.0–60.0)
pH, Ven: 7.049 — CL (ref 7.250–7.430)
pO2, Ven: 35.1 mmHg (ref 32.0–45.0)

## 2019-11-30 LAB — I-STAT BETA HCG BLOOD, ED (MC, WL, AP ONLY): I-stat hCG, quantitative: <5 m[IU]/mL (ref <5)

## 2019-11-30 LAB — CBG MONITORING, ED
Glucose-Capillary: 590 mg/dL (ref 70–99)
Glucose-Capillary: >600 mg/dL (ref 70–99)

## 2019-11-30 LAB — LIPASE, BLOOD: Lipase: 18 U/L (ref 11–51)

## 2019-11-30 MED ORDER — DEXTROSE-NACL 5-0.45 % IV SOLN: INTRAVENOUS | Status: DC

## 2019-11-30 MED ORDER — INSULIN REGULAR(HUMAN) IN NACL 100-0.9 UT/100ML-% IV SOLN
INTRAVENOUS | Status: DC
  Administered 2019-12-01: 10 [IU]/h via INTRAVENOUS
  Filled 2019-11-30 (×2): qty 100

## 2019-11-30 MED ORDER — SODIUM CHLORIDE 0.9 % IV SOLN: INTRAVENOUS | Status: DC

## 2019-11-30 MED ORDER — SODIUM CHLORIDE 0.9 % IV BOLUS
1000.0000 mL | INTRAVENOUS | Status: AC
  Administered 2019-11-30: 1000 mL via INTRAVENOUS

## 2019-11-30 MED ORDER — POTASSIUM CHLORIDE 10 MEQ/100ML IV SOLN
10.0000 meq | INTRAVENOUS | Status: AC
  Administered 2019-12-01 (×2): 10 meq via INTRAVENOUS
  Filled 2019-11-30 (×2): qty 100

## 2019-11-30 MED ORDER — DEXTROSE 50 % IV SOLN: 0.0000 mL | INTRAVENOUS | Status: DC | PRN

## 2019-11-30 NOTE — ED Notes (Signed)
Date and time results received: 11/30/19 8:16 PM  (use smartphrase ".now" to insert current time)  Test: Glucose Critical Value: 713  Name of Provider Notified: Dr.Tegeler  Orders Received? Or Actions Taken?:

## 2019-11-30 NOTE — ED Triage Notes (Signed)
Per EMS, patient from home, c/o N/V and weakness x3 days. CBG 584. Hx diabetes. + orthostatics.

## 2019-11-30 NOTE — H&P (Signed)
History and Physical    Jenna Frost WPV:[id number] DOB: Feb 17, 1971 DOA: 11/30/2019  PCP: Patient, No Pcp Per   Patient coming from: Home   Chief Complaint: Fatigue, N/V, dry mouth   HPI: Jenna Frost is a 49 y.o. female with medical history significant for type II DM, hypertension, and asthma, now presenting to the ED with 3 days of progressive fatigue, nausea, and non-bloody non-bilious vomiting. Patient reports recently moving to the area from [place], developed fatigue and nausea 3 days ago, and has since been increasingly fatigued and has developed polydipsia, dry mouth, and vomiting. She denies fevers, chills, abdominal pain, diarrhea, cough, or SOB. She also denies dysuria or flank pain. She called EMS today, was found to have CBG in 500s, and was brought to the ED.    ED Course: Upon arrival to the ED, patient is found to be afebrile, saturating well on room air, tachypneic with Kussmaul respirations, tachycardic in the 120s, and with stable blood pressure.  EKG features sinus tachycardia with rate 124.  Chest x-ray is negative for acute cardiopulmonary disease.  Chemistry panel notable for glucose of 713, bicarbonate 11, anion gap 34, and creatinine 1.78 with no priors available for comparison.  CBC features a leukocytosis to 13,500 and a thrombocytosis to 786,000.  Blood cultures were collected in the emergency department, IV fluids and insulin infusion were ordered but not yet started, and Covid screening test has not yet been collected.  Review of Systems:  All other systems reviewed and apart from HPI, are negative.  Past Medical History:  Diagnosis Date  . Anemia   . Asthma   . Diabetes mellitus without complication [facility])     Past Surgical History:  Procedure Laterality Date  . CHOLECYSTECTOMY      Social History:   reports previous alcohol use. She reports previous drug use. No history on file for tobacco use.  No Known Allergies  History reviewed. No  pertinent family history.   Prior to Admission medications   Medication Sig Start Date End Date Taking? Authorizing Provider  gabapentin (NEURONTIN) 100 MG capsule Take 100 mg by mouth daily. 10/02/19  Yes [provider]  ipratropium-albuterol (DUONEB) 0.5-2.5 (3) MG/3ML SOLN Take 3 mLs by nebulization 4 (four) times daily as needed (shortness  of breath).  09/03/19  Yes [provider]  losartan (COZAAR) 25 MG tablet Take 25 mg by mouth daily. 09/03/19  Yes [provider]  MAGNESIUM-OXIDE 400 (241.3 Mg) MG tablet Take 400 mg by mouth daily.  07/31/19  Yes [provider]  medroxyPROGESTERone (PROVERA) 10 MG tablet Take 10 mg by mouth daily. 07/31/19  Yes [provider]  metFORMIN (GLUCOPHAGE-XR) 500 MG 24 hr tablet Take 500 mg by mouth 2 (two) times daily. 07/31/19  Yes [provider]  montelukast (SINGULAIR) 10 MG tablet Take 10 mg by mouth daily. 08/18/19  Yes [provider]  pantoprazole (PROTONIX) 40 MG tablet Take 40 mg by mouth daily. 07/31/19  Yes [provider]  SYMBICORT 160-4.5 MCG/ACT inhaler Inhale 2 puffs into the lungs 2 (two) times daily. 10/02/19  Yes [provider]  TRADJENTA 5 MG TABS tablet Take 5 mg by mouth every morning. 09/03/19  Yes [provider]  VENTOLIN HFA 108 (90 Base) MCG/ACT inhaler Inhale 2 puffs into the lungs as needed for wheezing or shortness of breath. 10/02/19  Yes [provider]    Physical Exam: Vitals:   11/30/19 1642 11/30/19 2227  BP: 129/90 (!) 154/105  Pulse: (!) 125 (!) 133  Resp: 18 (!) 25  Temp: 98 F (36.7 C) (!) 97.4 F (36.3 C)  TempSrc: Oral Oral  SpO2: 100% 100%    Constitutional: Not in acute distress, lethargic  Eyes: PERTLA, lids and conjunctivae normal ENMT: Mucous membranes are moist. Posterior pharynx clear of any exudate or lesions.   Neck: normal, supple, no masses, no thyromegaly Respiratory: clear to auscultation  bilaterally, no wheezing. Tachypneic. No pallor or cyanosis.  Cardiovascular: Rate ~120 and regular. No extremity edema.   Abdomen: No distension, no tenderness, soft. Bowel sounds active.  Musculoskeletal: no clubbing / cyanosis. No joint deformity upper and lower extremities.   Skin: no significant rashes, lesions, ulcers. Warm, dry, well-perfused. Neurologic: CN 2-12 grossly intact. Sensation intact. Moving all extremities.  Psychiatric: Lethargic. Oriented to person, place, and situation. Pleasant and cooperative.    Labs and Imaging on Admission: I have personally reviewed following labs and imaging studies  CBC: Recent Labs  Lab 11/30/19 1900  WBC 13.5*  NEUTROABS 10.8*  HGB 13.3  HCT 41.4  MCV 87.7  PLT 786*   Basic Metabolic Panel: Recent Labs  Lab 11/30/19 1900 11/30/19 2300  NA 133* 133*  K 4.5 4.6  CL 88* 83*  CO2 11* 12*  GLUCOSE 713* 879*  BUN 20 25*  CREATININE 1.78* 2.48*  CALCIUM 10.4* 10.5*   GFR: CrCl cannot be calculated (Unknown ideal weight.). Liver Function Tests: Recent Labs  Lab 11/30/19 1900  AST 9*  ALT 12  ALKPHOS 93  BILITOT 1.8*  PROT 10.1*  ALBUMIN 4.7   Recent Labs  Lab 11/30/19 1900  LIPASE 18   No results for input(s): AMMONIA in the last 168 hours. Coagulation Profile: No results for input(s): INR, PROTIME in the last 168 hours. Cardiac Enzymes: No results for input(s): CKTOTAL, CKMB, CKMBINDEX, TROPONINI in the last 168 hours. BNP (last 3 results) No results for input(s): PROBNP in the last 8760 hours. HbA1C: No results for input(s): HGBA1C in the last 72 hours. CBG: Recent Labs  Lab 11/30/19 1641 11/30/19 2256  GLUCAP 590* >600*   Lipid Profile: No results for input(s): CHOL, HDL, LDLCALC, TRIG, CHOLHDL, LDLDIRECT in the last 72 hours. Thyroid Function Tests: No results for input(s): TSH, T4TOTAL, FREET4, T3FREE, THYROIDAB in the last 72 hours. Anemia Panel: No results for input(s): VITAMINB12, FOLATE,  FERRITIN, TIBC, IRON, RETICCTPCT in the last 72 hours. Urine analysis:    Component Value Date/Time   COLORURINE YELLOW 11/30/2019 2300   APPEARANCEUR CLEAR 11/30/2019 2300   LABSPEC 1.016 11/30/2019 2300   PHURINE 5.0 11/30/2019 2300   GLUCOSEU >=500 (A) 11/30/2019 2300   HGBUR SMALL (A) 11/30/2019 2300   BILIRUBINUR NEGATIVE 11/30/2019 2300   KETONESUR 80 (A) 11/30/2019 2300   PROTEINUR 30 (A) 11/30/2019 2300   NITRITE NEGATIVE 11/30/2019 2300   LEUKOCYTESUR TRACE (A) 11/30/2019 2300   Sepsis Labs: @LABRCNTIP (procalcitonin:4,lacticidven:4) )No results found for this or any previous visit (from the past 240 hour(s)).   Radiological Exams on Admission: DG Chest 1 View  Result Date: 11/30/2019 CLINICAL DATA:  Nausea, vomiting EXAM: CHEST  1 VIEW COMPARISON:  None. FINDINGS: The heart size and mediastinal contours are within normal limits. Both lungs are clear. The visualized skeletal structures are unremarkable. IMPRESSION: No active disease. Electronically Signed   By: 01/30/2020 MD   On: 11/30/2019 23:12    EKG: Independently reviewed. Sinus tachycardia (rate 124).   Assessment/Plan   1. DKA; type II DM  -  Patient is type II diabetic who recently moved to the area and presents with 3 days of progressive fatigue, malaise, N/V, and hyperglycemia, and is found to be in DKA with serum glucose 713, serum bicarb 11, elevated AG, and urine ketones  - IVF boluses and insulin infusion have been ordered from the ED but not yet started  - Start IVF resuscitation, insulin infusion with frequent CBGs and serial chem panels    2. Asthma  - No cough or wheezing on admission  - Continue Singulair, ICS/LABA, and as-needed SABA    3. Renal insufficiency  - SCr is 1.78 in ED without any prior labs available for comparison  - Likely acute prerenal azotemia secondary to DKA with N/V and osmotic diuresis  - Continue IVF hydration and glycemic-control, renally-dose medications, hold losartan,  repeat chem panel    4. SIRS  - Patient has tachycardia, tachypnea, and leukocytosis without fever or any apparent infectious process and likely due to DKA  - Blood cultures were collected in ED, will follow    5. Hypertension  - Hold losartan initially given renal insufficiency that is suspected to be acute  - Use labetalol as needed for now    DVT prophylaxis: Lovenox  Code Status: Full  Family Communication: Discussed with patient  Disposition Plan:  Patient is from: Home  Anticipated d/c is to: Home  Anticipated d/c date is: 12/03/19  Patient currently: In DKA  Consults called: None  Admission status: Inpatient     Briscoe Deutscher, MD [facility]  12/01/2019, 12:07 AM

## 2019-11-30 NOTE — ED Provider Notes (Signed)
[facility]-EMERGENCY DEPT Provider Note   CSN: [id number] Arrival date & time: 11/30/19  1631     History Hyperglycemia   Jenna Frost is a 49 y.o. female with past medical history significant for anemia, asthma, diabetes who presents for evaluation of nausea, vomiting, weakness and hyperglycemia.  Has had elevated blood sugars at home.  States she is not on insulin.  Multiple episodes of NBNB emesis.  No associated nominal pain.  No dysuria.  No diarrhea or constipation.  Denies headache, syncope, neck pain, neck stiffness, congestion, rhinorrhea, chest pain, shortness of breath, hemoptysis, domino pain, diarrhea, dysuria, lateral leg swelling, redness or warmth.  Denies aggravating or relieving factors.  Denies recent illnesses.  States she has been admitted for her diabetes for however cannot remember when.  States she did not get the Covid vaccine.  Has not been around any Covid positive people that she knows of.  Denies alcohol use, OTC medications, illicit substance use.   Recently moved from [place]  History obtained from patient and past medical records. No interpretor was used. HPI     Past Medical History:  Diagnosis Date  . Anemia   . Asthma   . Diabetes mellitus without complication [facility])     Patient Active Problem List   Diagnosis Date Noted  . DKA (diabetic ketoacidoses) (HCC) 11/30/2019  . Asthma   . Renal insufficiency   . SIRS (systemic inflammatory response syndrome) (HCC)     Past Surgical History:  Procedure Laterality Date  . CHOLECYSTECTOMY       OB History   No obstetric history on file.     Family History  Problem Relation Age of Onset  . Hypertension Mother   . Asthma Father     Social History   Tobacco Use  . Smoking status: Never Smoker  . Smokeless tobacco: Never Used  Substance Use Topics  . Alcohol use: Not Currently  . Drug use: Not Currently    Home Medications Prior to Admission medications     Medication Sig Start Date End Date Taking? Authorizing Provider  gabapentin (NEURONTIN) 100 MG capsule Take 100 mg by mouth daily. 10/02/19  Yes [provider]  ipratropium-albuterol (DUONEB) 0.5-2.5 (3) MG/3ML SOLN Take 3 mLs by nebulization 4 (four) times daily as needed (shortness  of breath).  09/03/19  Yes [provider]  losartan (COZAAR) 25 MG tablet Take 25 mg by mouth daily. 09/03/19  Yes [provider]  MAGNESIUM-OXIDE 400 (241.3 Mg) MG tablet Take 400 mg by mouth daily.  07/31/19  Yes [provider]  medroxyPROGESTERone (PROVERA) 10 MG tablet Take 10 mg by mouth daily. 07/31/19  Yes [provider]  metFORMIN (GLUCOPHAGE-XR) 500 MG 24 hr tablet Take 500 mg by mouth 2 (two) times daily. 07/31/19  Yes [provider]  montelukast (SINGULAIR) 10 MG tablet Take 10 mg by mouth daily. 08/18/19  Yes [provider]  pantoprazole (PROTONIX) 40 MG tablet Take 40 mg by mouth daily. 07/31/19  Yes [provider]  SYMBICORT 160-4.5 MCG/ACT inhaler Inhale 2 puffs into the lungs 2 (two) times daily. 10/02/19  Yes [provider]  TRADJENTA 5 MG TABS tablet Take 5 mg by mouth every morning. 09/03/19  Yes [provider]  VENTOLIN HFA 108 (90 Base) MCG/ACT inhaler Inhale 2 puffs into the lungs as needed for wheezing or shortness of breath. 10/02/19  Yes [provider]    Allergies    Patient has no known  allergies.  Review of Systems   Review of Systems  Constitutional: Positive for activity change, appetite change and fatigue.  HENT: Negative.   Respiratory: Negative.   Cardiovascular: Negative.   Gastrointestinal: Positive for nausea and vomiting. Negative for abdominal distention, abdominal pain, blood in stool, constipation, diarrhea and rectal pain.  Genitourinary: Negative.   Musculoskeletal: Negative.   Skin: Negative.   Neurological: Positive for weakness (Generalized) and light-headedness.  Negative for dizziness, tremors, seizures, syncope, facial asymmetry, speech difficulty, numbness and headaches.  All other systems reviewed and are negative.  Physical Exam Updated Vital Signs BP (!) 159/108   Pulse (!) 117   Temp (!) 97.4 F (36.3 C) (Oral)   Resp (!) 26   Ht 5\' 4"  (1.626 m)   Wt 80.7 kg   SpO2 100%   BMI 30.55 kg/m   Physical Exam Vitals and nursing note reviewed.  Constitutional:      Appearance: She is well-developed. She is ill-appearing.  HENT:     Head: Atraumatic.     Nose: Nose normal.     Mouth/Throat:     Mouth: Mucous membranes are moist.  Eyes:     Pupils: Pupils are equal, round, and reactive to light.  Cardiovascular:     Rate and Rhythm: Normal rate.     Pulses: Normal pulses.     Heart sounds: Normal heart sounds.  Pulmonary:     Effort: Pulmonary effort is normal. No respiratory distress.     Breath sounds: Normal breath sounds.     Comments: Kussmaul respirations. Speaks in 2-3 words answers  Abdominal:     General: Bowel sounds are normal. There is no distension.     Palpations: There is no mass.     Tenderness: There is no abdominal tenderness. There is no right CVA tenderness, left CVA tenderness or guarding.  Musculoskeletal:        General: No swelling, tenderness, deformity or signs of injury. Normal range of motion.     Cervical back: Normal range of motion.     Right lower leg: No edema.     Left lower leg: No edema.     Comments: Compartments soft. No unilateral leg swelling, redness, warmth  Skin:    General: Skin is warm and dry.     Capillary Refill: Capillary refill takes less than 2 seconds.  Neurological:     General: No focal deficit present.     Mental Status: She is alert and oriented to person, place, and time.    ED Results / Procedures / Treatments   Labs (all labs ordered are listed, but only abnormal results are displayed) Labs Reviewed  CBC WITH DIFFERENTIAL/PLATELET - Abnormal; Notable for the  following components:      Result Value   WBC 13.5 (*)    Platelets 786 (*)    Neutro Abs 10.8 (*)    All other components within normal limits  COMPREHENSIVE METABOLIC PANEL - Abnormal; Notable for the following components:   Sodium 133 (*)    Chloride 88 (*)    CO2 11 (*)    Glucose, Bld 713 (*)    Creatinine, Ser 1.78 (*)    Calcium 10.4 (*)    Total Protein 10.1 (*)    AST 9 (*)    Total Bilirubin 1.8 (*)    GFR calc non Af Amer 34 (*)    GFR calc Af Amer 39 (*)    Anion gap 34 (*)  All other components within normal limits  URINALYSIS, ROUTINE W REFLEX MICROSCOPIC - Abnormal; Notable for the following components:   Glucose, UA >=500 (*)    Hgb urine dipstick SMALL (*)    Ketones, ur 80 (*)    Protein, ur 30 (*)    Leukocytes,Ua TRACE (*)    All other components within normal limits  BASIC METABOLIC PANEL - Abnormal; Notable for the following components:   Sodium 133 (*)    Chloride 83 (*)    CO2 12 (*)    Glucose, Bld 879 (*)    BUN 25 (*)    Creatinine, Ser 2.48 (*)    Calcium 10.5 (*)    GFR calc non Af Amer 23 (*)    GFR calc Af Amer 26 (*)    Anion gap 38 (*)    All other components within normal limits  BETA-HYDROXYBUTYRIC ACID - Abnormal; Notable for the following components:   Beta-Hydroxybutyric Acid >8.00 (*)    All other components within normal limits  BLOOD GAS, VENOUS - Abnormal; Notable for the following components:   pH, Ven 7.049 (*)    pCO2, Ven 25.5 (*)    Bicarbonate 6.7 (*)    Acid-base deficit 23.4 (*)    All other components within normal limits  LACTIC ACID, PLASMA - Abnormal; Notable for the following components:   Lactic Acid, Venous 5.0 (*)    All other components within normal limits  CBG MONITORING, ED - Abnormal; Notable for the following components:   Glucose-Capillary 590 (*)    All other components within normal limits  CBG MONITORING, ED - Abnormal; Notable for the following components:   Glucose-Capillary >600 (*)    All  other components within normal limits  CBG MONITORING, ED - Abnormal; Notable for the following components:   Glucose-Capillary >600 (*)    All other components within normal limits  SARS CORONAVIRUS 2 BY RT PCR (HOSPITAL ORDER, PERFORMED IN [facility])  CULTURE, BLOOD (ROUTINE X 2)  CULTURE, BLOOD (ROUTINE X 2)  LIPASE, BLOOD  BASIC METABOLIC PANEL  BASIC METABOLIC PANEL  BASIC METABOLIC PANEL  BETA-HYDROXYBUTYRIC ACID  BASIC METABOLIC PANEL  BETA-HYDROXYBUTYRIC ACID  LACTIC ACID, PLASMA  I-STAT BETA HCG BLOOD, ED ([facility], [facility], [facility] ONLY)    EKG EKG Interpretation  Date/Time:  Tuesday November 30 2019 23:10:51 EDT Ventricular Rate:  124 PR Interval:    QRS Duration: 89 QT Interval:  353 QTC Calculation: 507 R Axis:   74 Text Interpretation: Sinus tachycardia Borderline prolonged QT interval No previous ECGs available Confirmed by Paula LibraMolpus, John ([id number]) on 11/30/2019 11:14:46 PM   Radiology DG Chest 1 View  Result Date: 11/30/2019 CLINICAL DATA:  Nausea, vomiting EXAM: CHEST  1 VIEW COMPARISON:  None. FINDINGS: The heart size and mediastinal contours are within normal limits. Both lungs are clear. The visualized skeletal structures are unremarkable. IMPRESSION: No active disease. Electronically Signed   By: Helyn NumbersAshesh  Parikh MD   On: 11/30/2019 23:12    Procedures .Critical Care Performed by: Linwood DibblesHenderly, Kylyn Sookram A, PA-C Authorized by: Linwood DibblesHenderly, Casey Fye A, PA-C   Critical care provider statement:    Critical care time (minutes):  45   Critical care was necessary to treat or prevent imminent or life-threatening deterioration of the following conditions:  Endocrine crisis   Critical care was time spent personally by me on the following activities:  Discussions with consultants, evaluation of patient's response to treatment, examination of patient, ordering and performing treatments and  interventions, ordering and review of laboratory studies, ordering and review of radiographic  studies, pulse oximetry, re-evaluation of patient's condition, obtaining history from patient or surrogate and review of old charts   (including critical care time)  Medications Ordered in ED Medications  insulin regular, human (MYXREDLIN) 100 units/ 100 mL infusion (10 Units/hr Intravenous New Bag/Given 12/01/19 0032)  0.9 %  sodium chloride infusion (has no administration in time range)  dextrose 50 % solution 0-50 mL (has no administration in time range)  sodium chloride 0.9 % bolus 1,000 mL (1,000 mLs Intravenous New Bag/Given (Non-Interop) 11/30/19 2325)  potassium chloride 10 mEq in 100 mL IVPB (10 mEq Intravenous New Bag/Given 12/01/19 0036)  dextrose 5 %-0.45 % sodium chloride infusion (has no administration in time range)  sodium chloride 0.9 % bolus 1,000 mL (1,000 mLs Intravenous New Bag/Given (Non-Interop) 12/01/19 0039)  labetalol (NORMODYNE) injection 10 mg (has no administration in time range)    ED Course  I have reviewed the triage vital signs and the nursing notes.  Pertinent labs & imaging results that were available during my care of the patient were reviewed by me and considered in my medical decision making (see chart for details).  49 year old presents for evaluation of hyperglycemia, weakness.  She is afebrile, nonseptic appearing however does appear ill.  Unfortunately had extended wait time out in the lobby of greater then 5-1/2 hours prior to her evaluation.  My initial assessment patient is tachycardic, tachypneic. She has a Kussmaul  respirations.  Clinically looks very dehydrated.  Abdomen soft, nontender.  She is sleepy however arousable to voice.  Alert and oriented x4.  Clinically patient appears to be in DKA.  I have low suspicion for sepsis however given she is tachycardic and tachypneic I will obtain blood cultures.  Her compartments are soft.  Clinically no evidence of DVT on exam.  She denies any recent surgeries, immobilization, history of PE or DVT.  IV fluid  bolus, slow infusion started for DKA.  Labs obtained from triage person reviewed and interpreted: CBC with leukocytosis at 13.5 Metabolic panel with mild hyponatremia at 133 however when corrected for hyperglycemia is 143, CO2 11, hyperglycemia to 713, creatinine 1.78, T Bilil 1.8, GFR 39, Anion gap 34>>> denies any EtOH, volatile ethanol, salicylate use. Preg negative Lipase 18 DG chest without acute findings EKG sinus tachycardia Urine with ketonuria, glucose urea, protein uria Lactic acid 5>>> question related to dehydration.  Suspicion for sepsis, blood cultures collected  Patient reassessed.  Difficult with IV access has delayed IV fluids and insulin.  Patient does have tachycardia, tachypnea.  Feel likely due to DKA.  Does have a mild leukocytosis however is afebrile and does not have any gross infectious process on exam.  Blood cultures obtained.  CONSULT with Dr. Antionette Char with [facility] evaluate patient for admission.  The patient appears reasonably stabilized for admission considering the current resources, flow, and capabilities available in the ED at this time, and I doubt any other [facility] requiring further screening and/or treatment in the ED prior to admission.    MDM Rules/Calculators/A&P                           Final Clinical Impression(s) / ED Diagnoses Final diagnoses:  SOB (shortness of breath)  Diabetic ketoacidosis without coma associated with type 2 diabetes mellitus (HCC)  AKI (acute kidney injury) (HCC)  SIRS (systemic inflammatory response syndrome) (HCC)    Rx / DC Orders  ED Discharge Orders    None       Alzora Ha A, PA-C 12/01/19 0120    Molpus, Jonny Ruiz, MD 12/01/19 [phone]

## 2019-12-01 ENCOUNTER — Encounter (HOSPITAL_COMMUNITY): Payer: Self-pay | Admitting: Family Medicine

## 2019-12-01 LAB — BASIC METABOLIC PANEL
Anion gap: 38 — ABNORMAL HIGH (ref 5–15)
Anion gap: 22 — ABNORMAL HIGH (ref 5–15)
Anion gap: 16 — ABNORMAL HIGH (ref 5–15)
Anion gap: 14 (ref 5–15)
BUN: 25 mg/dL — ABNORMAL HIGH (ref 6–20)
BUN: 23 mg/dL — ABNORMAL HIGH (ref 6–20)
BUN: 23 mg/dL — ABNORMAL HIGH (ref 6–20)
BUN: 24 mg/dL — ABNORMAL HIGH (ref 6–20)
BUN: 24 mg/dL — ABNORMAL HIGH (ref 6–20)
CO2: 12 mmol/L — ABNORMAL LOW (ref 22–32)
CO2: <7 mmol/L — ABNORMAL LOW (ref 22–32)
CO2: 14 mmol/L — ABNORMAL LOW (ref 22–32)
CO2: 18 mmol/L — ABNORMAL LOW (ref 22–32)
CO2: 20 mmol/L — ABNORMAL LOW (ref 22–32)
Calcium: 10.5 mg/dL — ABNORMAL HIGH (ref 8.9–10.3)
Calcium: 7.8 mg/dL — ABNORMAL LOW (ref 8.9–10.3)
Calcium: 8.5 mg/dL — ABNORMAL LOW (ref 8.9–10.3)
Calcium: 8.8 mg/dL — ABNORMAL LOW (ref 8.9–10.3)
Calcium: 8.7 mg/dL — ABNORMAL LOW (ref 8.9–10.3)
Chloride: 83 mmol/L — ABNORMAL LOW (ref 98–111)
Chloride: 101 mmol/L (ref 98–111)
Chloride: 101 mmol/L (ref 98–111)
Chloride: 103 mmol/L (ref 98–111)
Chloride: 104 mmol/L (ref 98–111)
Creatinine, Ser: 2.48 mg/dL — ABNORMAL HIGH (ref 0.44–1.00)
Creatinine, Ser: 2.01 mg/dL — ABNORMAL HIGH (ref 0.44–1.00)
Creatinine, Ser: 1.53 mg/dL — ABNORMAL HIGH (ref 0.44–1.00)
Creatinine, Ser: 1.18 mg/dL — ABNORMAL HIGH (ref 0.44–1.00)
Creatinine, Ser: 0.96 mg/dL (ref 0.44–1.00)
GFR calc Af Amer: 26 mL/min — ABNORMAL LOW (ref >60)
GFR calc Af Amer: 34 mL/min — ABNORMAL LOW (ref >60)
GFR calc Af Amer: 47 mL/min — ABNORMAL LOW (ref >60)
GFR calc Af Amer: >60 mL/min (ref >60)
GFR calc Af Amer: >60 mL/min (ref >60)
GFR calc non Af Amer: 23 mL/min — ABNORMAL LOW (ref >60)
GFR calc non Af Amer: 29 mL/min — ABNORMAL LOW (ref >60)
GFR calc non Af Amer: 40 mL/min — ABNORMAL LOW (ref >60)
GFR calc non Af Amer: 55 mL/min — ABNORMAL LOW (ref >60)
GFR calc non Af Amer: >60 mL/min (ref >60)
Glucose, Bld: 879 mg/dL (ref 70–99)
Glucose, Bld: 538 mg/dL (ref 70–99)
Glucose, Bld: 318 mg/dL — ABNORMAL HIGH (ref 70–99)
Glucose, Bld: 241 mg/dL — ABNORMAL HIGH (ref 70–99)
Glucose, Bld: 165 mg/dL — ABNORMAL HIGH (ref 70–99)
Potassium: 4.6 mmol/L (ref 3.5–5.1)
Potassium: 3.7 mmol/L (ref 3.5–5.1)
Potassium: 3.3 mmol/L — ABNORMAL LOW (ref 3.5–5.1)
Potassium: 3.6 mmol/L (ref 3.5–5.1)
Potassium: 3.5 mmol/L (ref 3.5–5.1)
Sodium: 133 mmol/L — ABNORMAL LOW (ref 135–145)
Sodium: 135 mmol/L (ref 135–145)
Sodium: 137 mmol/L (ref 135–145)
Sodium: 137 mmol/L (ref 135–145)
Sodium: 138 mmol/L (ref 135–145)

## 2019-12-01 LAB — MRSA PCR SCREENING: MRSA by PCR: POSITIVE — AB

## 2019-12-01 LAB — GLUCOSE, CAPILLARY
Glucose-Capillary: 225 mg/dL — ABNORMAL HIGH (ref 70–99)
Glucose-Capillary: 169 mg/dL — ABNORMAL HIGH (ref 70–99)
Glucose-Capillary: 191 mg/dL — ABNORMAL HIGH (ref 70–99)
Glucose-Capillary: 150 mg/dL — ABNORMAL HIGH (ref 70–99)
Glucose-Capillary: 167 mg/dL — ABNORMAL HIGH (ref 70–99)
Glucose-Capillary: 139 mg/dL — ABNORMAL HIGH (ref 70–99)
Glucose-Capillary: 133 mg/dL — ABNORMAL HIGH (ref 70–99)
Glucose-Capillary: 155 mg/dL — ABNORMAL HIGH (ref 70–99)
Glucose-Capillary: 158 mg/dL — ABNORMAL HIGH (ref 70–99)
Glucose-Capillary: 156 mg/dL — ABNORMAL HIGH (ref 70–99)
Glucose-Capillary: 152 mg/dL — ABNORMAL HIGH (ref 70–99)

## 2019-12-01 LAB — HIV ANTIBODY (ROUTINE TESTING W REFLEX): HIV Screen 4th Generation wRfx: NONREACTIVE

## 2019-12-01 LAB — BETA-HYDROXYBUTYRIC ACID
Beta-Hydroxybutyric Acid: >8 mmol/L — ABNORMAL HIGH (ref 0.05–0.27)
Beta-Hydroxybutyric Acid: 5.73 mmol/L — ABNORMAL HIGH (ref 0.05–0.27)
Beta-Hydroxybutyric Acid: 1.44 mmol/L — ABNORMAL HIGH (ref 0.05–0.27)

## 2019-12-01 LAB — CBG MONITORING, ED
Glucose-Capillary: 239 mg/dL — ABNORMAL HIGH (ref 70–99)
Glucose-Capillary: 305 mg/dL — ABNORMAL HIGH (ref 70–99)
Glucose-Capillary: 382 mg/dL — ABNORMAL HIGH (ref 70–99)
Glucose-Capillary: 417 mg/dL — ABNORMAL HIGH (ref 70–99)
Glucose-Capillary: 420 mg/dL — ABNORMAL HIGH (ref 70–99)
Glucose-Capillary: 426 mg/dL — ABNORMAL HIGH (ref 70–99)
Glucose-Capillary: 587 mg/dL (ref 70–99)
Glucose-Capillary: >600 mg/dL (ref 70–99)
Glucose-Capillary: >600 mg/dL (ref 70–99)
Glucose-Capillary: >600 mg/dL (ref 70–99)

## 2019-12-01 LAB — LACTIC ACID, PLASMA
Lactic Acid, Venous: 5 mmol/L (ref 0.5–1.9)
Lactic Acid, Venous: 3.4 mmol/L (ref 0.5–1.9)

## 2019-12-01 LAB — SARS CORONAVIRUS 2 BY RT PCR (HOSPITAL ORDER, PERFORMED IN ~~LOC~~ HOSPITAL LAB): SARS Coronavirus 2: NEGATIVE

## 2019-12-01 MED ORDER — PANTOPRAZOLE SODIUM 40 MG PO TBEC
40.0000 mg | DELAYED_RELEASE_TABLET | Freq: Every day | ORAL | Status: DC
  Administered 2019-12-01: 40 mg via ORAL
  Filled 2019-12-01: qty 1

## 2019-12-01 MED ORDER — SODIUM CHLORIDE 0.9 % IV BOLUS
1000.0000 mL | INTRAVENOUS | Status: AC
  Administered 2019-12-01 (×2): 1000 mL via INTRAVENOUS

## 2019-12-01 MED ORDER — ALBUTEROL SULFATE (2.5 MG/3ML) 0.083% IN NEBU: 2.5000 mg | INHALATION_SOLUTION | Freq: Four times a day (QID) | RESPIRATORY_TRACT | Status: DC | PRN

## 2019-12-01 MED ORDER — DEXTROSE-NACL 5-0.45 % IV SOLN: INTRAVENOUS | Status: DC

## 2019-12-01 MED ORDER — PANTOPRAZOLE SODIUM 40 MG IV SOLR
40.0000 mg | INTRAVENOUS | Status: DC
  Administered 2019-12-01 – 2019-12-03 (×3): 40 mg via INTRAVENOUS
  Filled 2019-12-01 (×2): qty 40

## 2019-12-01 MED ORDER — CHLORHEXIDINE GLUCONATE CLOTH 2 % EX PADS
6.0000 | MEDICATED_PAD | Freq: Every day | CUTANEOUS | Status: DC
  Administered 2019-12-03: 6 via TOPICAL

## 2019-12-01 MED ORDER — MONTELUKAST SODIUM 10 MG PO TABS
10.0000 mg | ORAL_TABLET | Freq: Every day | ORAL | Status: DC
  Administered 2019-12-01 – 2019-12-02 (×2): 10 mg via ORAL
  Filled 2019-12-01 (×2): qty 1

## 2019-12-01 MED ORDER — GABAPENTIN 100 MG PO CAPS
100.0000 mg | ORAL_CAPSULE | Freq: Every day | ORAL | Status: DC
  Administered 2019-12-01 – 2019-12-03 (×3): 100 mg via ORAL
  Filled 2019-12-01 (×3): qty 1

## 2019-12-01 MED ORDER — ALBUTEROL SULFATE HFA 108 (90 BASE) MCG/ACT IN AERS: 2.0000 | INHALATION_SPRAY | RESPIRATORY_TRACT | Status: DC | PRN

## 2019-12-01 MED ORDER — CALCIUM CARBONATE ANTACID 500 MG PO CHEW
1.0000 | CHEWABLE_TABLET | Freq: Three times a day (TID) | ORAL | Status: DC | PRN
  Administered 2019-12-01 – 2019-12-02 (×2): 200 mg via ORAL
  Filled 2019-12-01 (×2): qty 1

## 2019-12-01 MED ORDER — MUPIROCIN 2 % EX OINT
1.0000 "application " | TOPICAL_OINTMENT | Freq: Two times a day (BID) | CUTANEOUS | Status: DC
  Administered 2019-12-01 – 2019-12-03 (×3): 1 via NASAL
  Filled 2019-12-01: qty 22

## 2019-12-01 MED ORDER — CHLORHEXIDINE GLUCONATE CLOTH 2 % EX PADS
6.0000 | MEDICATED_PAD | Freq: Every day | CUTANEOUS | Status: DC
  Administered 2019-12-01: 6 via TOPICAL

## 2019-12-01 MED ORDER — ONDANSETRON HCL 4 MG/2ML IJ SOLN
4.0000 mg | Freq: Four times a day (QID) | INTRAMUSCULAR | Status: DC | PRN
  Administered 2019-12-01 – 2019-12-02 (×3): 4 mg via INTRAVENOUS
  Filled 2019-12-01 (×3): qty 2

## 2019-12-01 MED ORDER — INSULIN GLARGINE 100 UNIT/ML ~~LOC~~ SOLN
14.0000 [IU] | Freq: Every day | SUBCUTANEOUS | Status: DC
  Administered 2019-12-01 – 2019-12-03 (×3): 14 [IU] via SUBCUTANEOUS
  Filled 2019-12-01 (×3): qty 0.14

## 2019-12-01 MED ORDER — ENOXAPARIN SODIUM 40 MG/0.4ML ~~LOC~~ SOLN
40.0000 mg | SUBCUTANEOUS | Status: DC
  Administered 2019-12-01 – 2019-12-03 (×3): 40 mg via SUBCUTANEOUS
  Filled 2019-12-01 (×3): qty 0.4

## 2019-12-01 MED ORDER — LABETALOL HCL 5 MG/ML IV SOLN: 10.0000 mg | INTRAVENOUS | Status: DC | PRN

## 2019-12-01 MED ORDER — MOMETASONE FURO-FORMOTEROL FUM 200-5 MCG/ACT IN AERO
2.0000 | INHALATION_SPRAY | Freq: Two times a day (BID) | RESPIRATORY_TRACT | Status: DC
  Administered 2019-12-02: 2 via RESPIRATORY_TRACT
  Filled 2019-12-01 (×4): qty 8.8

## 2019-12-01 NOTE — ED Notes (Signed)
Report given to Clovis Riley, Charity fundraiser.

## 2019-12-01 NOTE — ED Notes (Signed)
Attempted to call report to Clovis Riley, RN at time listed on purple man, he is with another patient and requested that I call back.

## 2019-12-01 NOTE — Progress Notes (Signed)
PROGRESS NOTE    Jenna Frost  NIO:[id number] DOB: [birth year] DOA: 11/30/2019 PCP: Patient, No Pcp Per    Brief Narrative: 49 year old female with history of type 2 diabetes and hypertension and asthma recently moved into the area from [place] ran out of her medications and is admitted with hypoglycemia with CBGs in the 500s.  Upon arrival to the ER her blood sugar was 713 with a bicarb of 11 and a gap of 34.  Assessment & Plan:   Principal Problem:   DKA (diabetic ketoacidoses) (HCC) Active Problems:   Asthma   Renal insufficiency   SIRS (systemic inflammatory response syndrome) (HCC)   #1 DKA patient admitted with hyperglycemia glucose of 713 and presence of urine ketones and elevated gap of 34 down to 22. A1c pending Patient was on Tradjenta Metformin at home Continue IV insulin BMP every 6 Continue IV fluids Continue Endo tool protocol.  #2 AKI secondary to dehydration osmotic diuresis.  Continue IV fluids hold losartan.  Creatinine on admission 1.78 peaked at 2.48 now down to 1.53 with IV hydration. Patient continues to be nauseous and vomited. Start Zofran, Protonix.  Patient complaining of epigastric pain and reflux.  #3 history of asthma continue inhalers Dulera, Singulair  #4 3 of essential hypertension on losartan which is on hold due to AKI.  Blood pressure 96/47 increased IV fluids to 150 cc an hour.  #5 SIRS-at the time of admission patient was tachypneic tachycardic with leukocytosis and lactic acidosis.  Patient was afebrile.  There was no focal infection present on admission.  Blood cultures done in the ER.  Lactic acid improving to 3.4 from 5.    Estimated body mass index is 30.55 kg/m as calculated from the following:   Height as of this encounter: 5\' 4"  (1.626 m).   Weight as of this encounter: 80.7 kg.  DVT prophylaxis: Lovenox Code Status: Full code Family Communication: None at bedside Disposition Plan:  Status is: Inpatient  Dispo: The  patient is from: Home              Anticipated d/c is to: Home              Anticipated d/c date is: Unknown               Patient currently is not medically stable to d/c.  Patient admitted with DKA and is on IV insulin   Consultants: None  Procedures: None Antimicrobials none  Subjective: Patient is awake and alert she reports she feels better than when she first came into the ER.  She complains of heartburn but no shortness of breath she still have palpitations but better than before.  Objective: Vitals:   12/01/19 0700 12/01/19 0830 12/01/19 0900 12/01/19 0932  BP: 135/90 138/85 133/90 (!) 96/47  Pulse: (!) 122 (!) 123 (!) 119 (!) 119  Resp: (!) 25 19 19 12   Temp:    98 F (36.7 C)  TempSrc:    Oral  SpO2: 97% 99% 99% 100%  Weight:      Height:        Intake/Output Summary (Last 24 hours) at 12/01/2019 0944 Last data filed at 12/01/2019 0800 Gross per 24 hour  Intake 3684.26 ml  Output --  Net 3684.26 ml   Filed Weights   12/01/19 0038  Weight: 80.7 kg    Examination:  General exam: Appears calm and comfortable  Respiratory system: Clear to auscultation. Respiratory effort normal. Cardiovascular system: S1 & S2 heard, RRR.  No JVD, murmurs, rubs, gallops or clicks. No pedal edema. Gastrointestinal system: Abdomen is nondistended, soft and epigastric tenderness. No organomegaly or masses felt. Normal bowel sounds heard. Central nervous system: Alert and oriented. No focal neurological deficits. Extremities: Symmetric 5 x 5 power. Skin: No rashes, lesions or ulcers Psychiatry: Judgement and insight appear normal. Mood & affect appropriate.     Data Reviewed: I have personally reviewed following labs and imaging studies  CBC: Recent Labs  Lab 11/30/19 1900  WBC 13.5*  NEUTROABS 10.8*  HGB 13.3  HCT 41.4  MCV 87.7  PLT 786*   Basic Metabolic Panel: Recent Labs  Lab 11/30/19 1900 11/30/19 2300 12/01/19 0313 12/01/19 0616  NA 133* 133* 135 137  K  4.5 4.6 3.7 3.3*  CL 88* 83* 101 101  CO2 11* 12* <7* 14*  GLUCOSE 713* 879* 538* 318*  BUN 20 25* 23* 23*  CREATININE 1.78* 2.48* 2.01* 1.53*  CALCIUM 10.4* 10.5* 7.8* 8.5*   GFR: Estimated Creatinine Clearance: 47.2 mL/min (A) (by C-G formula based on SCr of 1.53 mg/dL (H)). Liver Function Tests: Recent Labs  Lab 11/30/19 1900  AST 9*  ALT 12  ALKPHOS 93  BILITOT 1.8*  PROT 10.1*  ALBUMIN 4.7   Recent Labs  Lab 11/30/19 1900  LIPASE 18   No results for input(s): AMMONIA in the last 168 hours. Coagulation Profile: No results for input(s): INR, PROTIME in the last 168 hours. Cardiac Enzymes: No results for input(s): CKTOTAL, CKMB, CKMBINDEX, TROPONINI in the last 168 hours. BNP (last 3 results) No results for input(s): PROBNP in the last 8760 hours. HbA1C: No results for input(s): HGBA1C in the last 72 hours. CBG: Recent Labs  Lab 12/01/19 0449 12/01/19 0525 12/01/19 0612 12/01/19 0724 12/01/19 0832  GLUCAP 420* 417* 382* 305* 239*   Lipid Profile: No results for input(s): CHOL, HDL, LDLCALC, TRIG, CHOLHDL, LDLDIRECT in the last 72 hours. Thyroid Function Tests: No results for input(s): TSH, T4TOTAL, FREET4, T3FREE, THYROIDAB in the last 72 hours. Anemia Panel: No results for input(s): VITAMINB12, FOLATE, FERRITIN, TIBC, IRON, RETICCTPCT in the last 72 hours. Sepsis Labs: Recent Labs  Lab 11/30/19 2300 12/01/19 0616  LATICACIDVEN 5.0* 3.4*    Recent Results (from the past 240 hour(s))  SARS Coronavirus 2 by RT PCR (hospital order, performed in [facility] hospital lab) Nasopharyngeal Nasopharyngeal Swab     Status: None   Collection Time: 11/30/19 11:00 PM   Specimen: Nasopharyngeal Swab  Result Value Ref Range Status   SARS Coronavirus 2 NEGATIVE NEGATIVE Final    Comment: (NOTE) SARS-CoV-2 target nucleic acids are NOT DETECTED.  The SARS-CoV-2 RNA is generally detectable in upper and lower respiratory specimens during the acute phase of  infection. The lowest concentration of SARS-CoV-2 viral copies this assay can detect is 250 copies / mL. A negative result does not preclude SARS-CoV-2 infection and should not be used as the sole basis for treatment or other patient management decisions.  A negative result may occur with improper specimen collection / handling, submission of specimen other than nasopharyngeal swab, presence of viral mutation(s) within the areas targeted by this assay, and inadequate number of viral copies (<250 copies / mL). A negative result must be combined with clinical observations, patient history, and epidemiological information.  Fact Sheet for Patients:   BoilerBrush.com.cy  Fact Sheet for Healthcare Providers: https://pope.com/  This test is not yet approved or  cleared by the [place] FDA and has been authorized for detection  and/or diagnosis of SARS-CoV-2 by FDA under an Emergency Use Authorization (EUA).  This EUA will remain in effect (meaning this test can be used) for the duration of the COVID-19 declaration under Section 564(b)(1) of the Act, 21 U.S.C. section 360bbb-3(b)(1), unless the authorization is terminated or revoked sooner.  Performed at [facility], [address] [place], [place] [address]          Radiology Studies: DG Chest 1 View  Result Date: 11/30/2019 CLINICAL DATA:  Nausea, vomiting EXAM: CHEST  1 VIEW COMPARISON:  None. FINDINGS: The heart size and mediastinal contours are within normal limits. Both lungs are clear. The visualized skeletal structures are unremarkable. IMPRESSION: No active disease. Electronically Signed   By: Helyn Numbers MD   On: 11/30/2019 23:12        Scheduled Meds: . Chlorhexidine Gluconate Cloth  6 each Topical Daily  . enoxaparin (LOVENOX) injection  40 mg Subcutaneous Q24H  . gabapentin  100 mg Oral Daily  . mometasone-formoterol  2 puff Inhalation BID   . montelukast  10 mg Oral QHS  . pantoprazole  40 mg Oral Daily   Continuous Infusions: . sodium chloride    . dextrose 5 % and 0.45% NaCl 100 mL/hr at 12/01/19 0800  . insulin 9.5 mL/hr at 12/01/19 0800     LOS: 1 day     Alwyn Ren, MD 12/01/2019, 9:44 AM

## 2019-12-01 NOTE — ED Notes (Signed)
This nurse advised pt against constant use of her albuterol inhaler. Pt states she has asthma and has been using the inhaler to make her feel better. Pt was informed that continuous use of her inhaler would increase her heart rate even more. Inhaler was placed on side table.

## 2019-12-01 NOTE — Plan of Care (Signed)

## 2019-12-01 NOTE — ED Notes (Signed)
Date and time results received: 12/01/19 0010 (use smartphrase ".now" to insert current time)  Test: glucose, lactic acid Critical Value: 879, 5.0  Name of Provider Notified: Odie Sera, MD  Orders Received? Or Actions Taken?: view orders

## 2019-12-01 NOTE — ED Notes (Signed)
Assumed care of patient at this time, nad noted, sr up x2, bed locked and low, call bell w/I reach.  Will continue to monitor.  IVF are infusing well with no s/s of infiltration via IV pump in the hand and the right ac.  Will continue to monitor.

## 2019-12-02 LAB — COMPREHENSIVE METABOLIC PANEL
ALT: 10 U/L (ref 0–44)
AST: 10 U/L — ABNORMAL LOW (ref 15–41)
Albumin: 3.2 g/dL — ABNORMAL LOW (ref 3.5–5.0)
Alkaline Phosphatase: 64 U/L (ref 38–126)
Anion gap: 11 (ref 5–15)
BUN: 11 mg/dL (ref 6–20)
CO2: 21 mmol/L — ABNORMAL LOW (ref 22–32)
Calcium: 8.3 mg/dL — ABNORMAL LOW (ref 8.9–10.3)
Chloride: 100 mmol/L (ref 98–111)
Creatinine, Ser: 0.81 mg/dL (ref 0.44–1.00)
GFR calc Af Amer: >60 mL/min (ref >60)
GFR calc non Af Amer: >60 mL/min (ref >60)
Glucose, Bld: 307 mg/dL — ABNORMAL HIGH (ref 70–99)
Potassium: 2.7 mmol/L — CL (ref 3.5–5.1)
Sodium: 132 mmol/L — ABNORMAL LOW (ref 135–145)
Total Bilirubin: 1.1 mg/dL (ref 0.3–1.2)
Total Protein: 7.3 g/dL (ref 6.5–8.1)

## 2019-12-02 LAB — BASIC METABOLIC PANEL
Anion gap: 17 — ABNORMAL HIGH (ref 5–15)
BUN: 14 mg/dL (ref 6–20)
CO2: 19 mmol/L — ABNORMAL LOW (ref 22–32)
Calcium: 9.2 mg/dL (ref 8.9–10.3)
Chloride: 102 mmol/L (ref 98–111)
Creatinine, Ser: 0.81 mg/dL (ref 0.44–1.00)
GFR calc Af Amer: >60 mL/min (ref >60)
GFR calc non Af Amer: >60 mL/min (ref >60)
Glucose, Bld: 305 mg/dL — ABNORMAL HIGH (ref 70–99)
Potassium: 4.2 mmol/L (ref 3.5–5.1)
Sodium: 138 mmol/L (ref 135–145)

## 2019-12-02 LAB — CBC
HCT: 31.3 % — ABNORMAL LOW (ref 36.0–46.0)
Hemoglobin: 10.3 g/dL — ABNORMAL LOW (ref 12.0–15.0)
MCH: 27.8 pg (ref 26.0–34.0)
MCHC: 32.9 g/dL (ref 30.0–36.0)
MCV: 84.4 fL (ref 80.0–100.0)
Platelets: 552 10*3/uL — ABNORMAL HIGH (ref 150–400)
RBC: 3.71 MIL/uL — ABNORMAL LOW (ref 3.87–5.11)
RDW: 14.3 % (ref 11.5–15.5)
WBC: 9.2 10*3/uL (ref 4.0–10.5)
nRBC: 0 % (ref 0.0–0.2)

## 2019-12-02 LAB — GLUCOSE, CAPILLARY
Glucose-Capillary: 233 mg/dL — ABNORMAL HIGH (ref 70–99)
Glucose-Capillary: 272 mg/dL — ABNORMAL HIGH (ref 70–99)
Glucose-Capillary: 283 mg/dL — ABNORMAL HIGH (ref 70–99)
Glucose-Capillary: 292 mg/dL — ABNORMAL HIGH (ref 70–99)
Glucose-Capillary: 322 mg/dL — ABNORMAL HIGH (ref 70–99)

## 2019-12-02 LAB — MAGNESIUM: Magnesium: 1.6 mg/dL — ABNORMAL LOW (ref 1.7–2.4)

## 2019-12-02 LAB — HEMOGLOBIN A1C
Hgb A1c MFr Bld: 15.3 % — ABNORMAL HIGH (ref 4.8–5.6)
Mean Plasma Glucose: 392 mg/dL

## 2019-12-02 MED ORDER — POTASSIUM CHLORIDE CRYS ER 20 MEQ PO TBCR
40.0000 meq | EXTENDED_RELEASE_TABLET | ORAL | Status: AC
  Administered 2019-12-02 (×3): 40 meq via ORAL
  Filled 2019-12-02 (×3): qty 2

## 2019-12-02 MED ORDER — INSULIN ASPART 100 UNIT/ML ~~LOC~~ SOLN
8.0000 [IU] | Freq: Three times a day (TID) | SUBCUTANEOUS | Status: DC
  Administered 2019-12-02 – 2019-12-03 (×3): 8 [IU] via SUBCUTANEOUS

## 2019-12-02 MED ORDER — INSULIN ASPART 100 UNIT/ML ~~LOC~~ SOLN
0.0000 [IU] | Freq: Every day | SUBCUTANEOUS | Status: DC
  Administered 2019-12-02: 3 [IU] via SUBCUTANEOUS

## 2019-12-02 MED ORDER — INSULIN GLARGINE 100 UNIT/ML ~~LOC~~ SOLN
10.0000 [IU] | Freq: Every day | SUBCUTANEOUS | Status: DC
  Administered 2019-12-02: 10 [IU] via SUBCUTANEOUS
  Filled 2019-12-02 (×2): qty 0.1

## 2019-12-02 MED ORDER — INSULIN ASPART 100 UNIT/ML ~~LOC~~ SOLN
0.0000 [IU] | Freq: Three times a day (TID) | SUBCUTANEOUS | Status: DC
  Administered 2019-12-02: 4 [IU] via SUBCUTANEOUS
  Administered 2019-12-02: 3 [IU] via SUBCUTANEOUS
  Administered 2019-12-02: 4 [IU] via SUBCUTANEOUS
  Administered 2019-12-03: 3 [IU] via SUBCUTANEOUS

## 2019-12-02 MED ORDER — CARVEDILOL 12.5 MG PO TABS
12.5000 mg | ORAL_TABLET | Freq: Two times a day (BID) | ORAL | Status: DC
  Administered 2019-12-02 – 2019-12-03 (×2): 12.5 mg via ORAL
  Filled 2019-12-02 (×2): qty 1

## 2019-12-02 NOTE — Progress Notes (Signed)
Inpatient Diabetes Program Recommendations  AACE/ADA: New Consensus Statement on Inpatient Glycemic Control (2015)  Target Ranges:  Prepandial:   less than 140 mg/dL      Peak postprandial:   less than 180 mg/dL (1-2 hours)      Critically ill patients:  140 - 180 mg/dL   Lab Results  Component Value Date   GLUCAP 322 (H) 12/02/2019   HGBA1C 15.3 (H) 12/01/2019    Review of Glycemic Control  Diabetes history: DM2 Outpatient Diabetes medications: Levemir 35 units QHS, tradjenta 5 mg QD, metformin 500 mg bid  Current orders for Inpatient glycemic control: Levemir 14 units in am and 10 units QHS, Novolog 0-6 units tidwc and hs + 8 units tidwc  HgbA1C - 15.3%  Inpatient Diabetes Program Recommendations:     For Home:  Levemir Flexpen 12 units bid (#[id number]) Novolog Flexpen 8 units tidwc for meal coverage (#[id number]) tradjenta 5 mg QD Metformin 1000 mg bid  Will need pen needles - #[id number] Blood glucose meter kit - #[id number]  Spoke to pt about home meds. States she has been taking Levemir 35 units QHS for quite awhile. States she occasionally has hypoglycemia upon waking up in am, then blood sugars high throughout the day. Discussed long-acting insulin and rapid-acting insulin, and likely needs rapid-acting to cover her food at home. Reviewed hypoglycemia s/s and treatment. Discussed importance of checking blood sugars 3-4x/day and taking logbook to PCP for review and possible adjustments. Discussed HgbA1C of 15.3% and importance of lowering to approx 7% for good glycemic control. Pt voices understanding.   Continue to follow. Discussed above with MD and RN.  Thank you. Lorenda Peck, RD, LDN, CDE Inpatient Diabetes Coordinator [phone]

## 2019-12-02 NOTE — Progress Notes (Signed)
Critical potassium received 2.7, called to Dr Jerolyn Center, orders received.  Potasium po started

## 2019-12-02 NOTE — TOC Initial Note (Signed)
Transition of Care [facility]) - Initial/Assessment Note    Patient Details  Name: Jenna Frost MRN: [id number] Date of Birth: 1970/10/19  Transition of Care [facility]) CM/SW Contact:    Golda Acre, RN Phone Number: 12/02/2019, 1:47 PM  Clinical Narrative:                 Dka/ anion gap remains elevated at 17.0, trnasitioned to sub q insulin 081221/ glucose reanging from 300-500. Following for progression and toc needs.  Expected Discharge Plan: Home/Self Care Barriers to Discharge: Continued Medical Work up   Patient Goals and CMS Choice Patient states their goals for this hospitalization and ongoing recovery are:: to go home CMS Medicare.gov Compare Post Acute Care list provided to:: Patient    Expected Discharge Plan and Services Expected Discharge Plan: Home/Self Care       Living arrangements for the past 2 months: Single Family Home                                      Prior Living Arrangements/Services Living arrangements for the past 2 months: Single Family Home Lives with:: Self Patient language and need for interpreter reviewed:: Yes Do you feel safe going back to the place where you live?: Yes      Need for Family Participation in Patient Care: Yes (Comment) Care giver support system in place?: Yes (comment)   Criminal Activity/Legal Involvement Pertinent to Current Situation/Hospitalization: No - Comment as needed  Activities of Daily Living Home Assistive Devices/Equipment: CBG Meter, Nebulizer ADL Screening (condition at time of admission) Patient's cognitive ability adequate to safely complete daily activities?: Yes Is the patient deaf or have difficulty hearing?: No Does the patient have difficulty seeing, even when wearing glasses/contacts?: No Does the patient have difficulty concentrating, remembering, or making decisions?: No Patient able to express need for assistance with ADLs?: Yes Does the patient have difficulty dressing or bathing?:  No Independently performs ADLs?: Yes (appropriate for developmental age) Does the patient have difficulty walking or climbing stairs?: Yes (secondary to weakness) Weakness of Legs: Both Weakness of Arms/Hands: None  Permission Sought/Granted                  Emotional Assessment Appearance:: Appears stated age Attitude/Demeanor/Rapport: Engaged Affect (typically observed): Calm Orientation: : Oriented to Place, Oriented to  Time, Oriented to Situation, Oriented to Self Alcohol / Substance Use: Not Applicable Psych Involvement: No (comment)  Admission diagnosis:  DKA (diabetic ketoacidoses) (HCC) [E11.10] SOB (shortness of breath) [R06.02] SIRS (systemic inflammatory response syndrome) (HCC) [R65.10] AKI (acute kidney injury) (HCC) [N17.9] Diabetic ketoacidosis without coma associated with type 2 diabetes mellitus (HCC) [E11.10] Patient Active Problem List   Diagnosis Date Noted   DKA (diabetic ketoacidoses) (HCC) 11/30/2019   Asthma    Renal insufficiency    SIRS (systemic inflammatory response syndrome) (HCC)    PCP:  Patient, No Pcp Per Pharmacy:   [id number] [place], [place] - [address] AT [facility] [address] & [facility] [place] [address] Phone: [phone] Fax: [phone]     Social Determinants of Health (SDOH) Interventions    Readmission Risk Interventions No flowsheet data found.

## 2019-12-02 NOTE — Progress Notes (Signed)
PROGRESS NOTE    Jenna Frost  NLZ:[id number] DOB: July 09, 1970 DOA: 11/30/2019 PCP: Patient, No Pcp Per    Brief Narrative: 49 year old female with history of type 2 diabetes and hypertension and asthma recently moved into the area from [place] ran out of her medications and is admitted with hypoglycemia with CBGs in the 500s.  Upon arrival to the ER her blood sugar was 713 with a bicarb of 11 and a gap of 34.  Assessment & Plan:   Principal Problem:   DKA (diabetic ketoacidoses) (HCC) Active Problems:   Asthma   Renal insufficiency   SIRS (systemic inflammatory response syndrome) (HCC)   #1 DKA patient admitted with hyperglycemia glucose of 713 and presence of urine ketones and elevated gap of 34 down to 22. A1c 15.3  Patient was on Tradjenta Metformin at home, and Levemir 35 units at home. Patient received Lantus 14 units this a.m. Will give additional 10 units tonight with NovoLog 8 units 3 times a day with meals. Discussed with diabetic coordinator.  Appreciate her input.  #2 AKI secondary to dehydration osmotic diuresis.  Continue IV fluids hold losartan.  Follow-up labs today pending.    #3 history of asthma continue inhalers Dulera, Singulair  #4 3 of essential hypertension on losartan which is on hold due to AKI.  We will start her on a beta-blocker will help with tachycardia too  #5 SIRS-at the time of admission patient was tachypneic tachycardic with leukocytosis and lactic acidosis.  Patient was afebrile.  There was no focal infection present on admission.  Blood cultures done in the ER.  Lactic acid improving to 3.4 from 5.    Estimated body mass index is 30.55 kg/m as calculated from the following:   Height as of this encounter: 5\' 4"  (1.626 m).   Weight as of this encounter: 80.7 kg.  DVT prophylaxis: Lovenox Code Status: Full code Family Communication: None at bedside Disposition Plan:  Status is: Inpatient  Dispo: The patient is from: Home               Anticipated d/c is to: Home              Anticipated d/c date is: Unknown               Patient currently is not medically stable to d/c.  Patient admitted with DKA    Consultants: None  Procedures: None Antimicrobials none  Subjective: Patient is resting in bed awake alert still nauseous but no vomiting remains tachycardic and hypertensive. Objective: Vitals:   12/02/19 0600 12/02/19 0850 12/02/19 0907 12/02/19 1218  BP: (!) 145/96   (!) 149/84  Pulse: (!) 113   (!) 107  Resp: (!) 23   (!) 28  Temp:   97.9 F (36.6 C) 97.9 F (36.6 C)  TempSrc:   Oral Oral  SpO2: 99% 99%  99%  Weight:      Height:        Intake/Output Summary (Last 24 hours) at 12/02/2019 1350 Last data filed at 12/02/2019 1028 Gross per 24 hour  Intake 505.06 ml  Output 950 ml  Net -444.94 ml   Filed Weights   12/01/19 0038  Weight: 80.7 kg    Examination:  General exam: Appears calm and comfortable  Respiratory system: Clear to auscultation. Respiratory effort normal. Cardiovascular system: S1 & S2 heard, RRR. No JVD, murmurs, rubs, gallops or clicks. No pedal edema. Gastrointestinal system: Abdomen is nondistended, soft and epigastric tenderness. No organomegaly  or masses felt. Normal bowel sounds heard. Central nervous system: Alert and oriented. No focal neurological deficits. Extremities: Symmetric 5 x 5 power. Skin: No rashes, lesions or ulcers Psychiatry: Judgement and insight appear normal. Mood & affect appropriate.     Data Reviewed: I have personally reviewed following labs and imaging studies  CBC: Recent Labs  Lab 11/30/19 1900  WBC 13.5*  NEUTROABS 10.8*  HGB 13.3  HCT 41.4  MCV 87.7  PLT 786*   Basic Metabolic Panel: Recent Labs  Lab 12/01/19 0313 12/01/19 0616 12/01/19 0947 12/01/19 1428 12/02/19 0644  NA 135 137 137 138 138  K 3.7 3.3* 3.6 3.5 4.2  CL 101 101 103 104 102  CO2 <7* 14* 18* 20* 19*  GLUCOSE 538* 318* 241* 165* 305*  BUN 23* 23* 24* 24* 14    CREATININE 2.01* 1.53* 1.18* 0.96 0.81  CALCIUM 7.8* 8.5* 8.8* 8.7* 9.2   GFR: Estimated Creatinine Clearance: 89.2 mL/min (by C-G formula based on SCr of 0.81 mg/dL). Liver Function Tests: Recent Labs  Lab 11/30/19 1900  AST 9*  ALT 12  ALKPHOS 93  BILITOT 1.8*  PROT 10.1*  ALBUMIN 4.7   Recent Labs  Lab 11/30/19 1900  LIPASE 18   No results for input(s): AMMONIA in the last 168 hours. Coagulation Profile: No results for input(s): INR, PROTIME in the last 168 hours. Cardiac Enzymes: No results for input(s): CKTOTAL, CKMB, CKMBINDEX, TROPONINI in the last 168 hours. BNP (last 3 results) No results for input(s): PROBNP in the last 8760 hours. HbA1C: Recent Labs    12/01/19 0616  HGBA1C 15.3*   CBG: Recent Labs  Lab 12/01/19 2053 12/01/19 2351 12/02/19 0339 12/02/19 0844 12/02/19 1231  GLUCAP 152* 233* 272* 292* 322*   Lipid Profile: No results for input(s): CHOL, HDL, LDLCALC, TRIG, CHOLHDL, LDLDIRECT in the last 72 hours. Thyroid Function Tests: No results for input(s): TSH, T4TOTAL, FREET4, T3FREE, THYROIDAB in the last 72 hours. Anemia Panel: No results for input(s): VITAMINB12, FOLATE, FERRITIN, TIBC, IRON, RETICCTPCT in the last 72 hours. Sepsis Labs: Recent Labs  Lab 11/30/19 2300 12/01/19 0616  LATICACIDVEN 5.0* 3.4*    Recent Results (from the past 240 hour(s))  SARS Coronavirus 2 by RT PCR (hospital order, performed in [facility] hospital lab) Nasopharyngeal Nasopharyngeal Swab     Status: None   Collection Time: 11/30/19 11:00 PM   Specimen: Nasopharyngeal Swab  Result Value Ref Range Status   SARS Coronavirus 2 NEGATIVE NEGATIVE Final    Comment: (NOTE) SARS-CoV-2 target nucleic acids are NOT DETECTED.  The SARS-CoV-2 RNA is generally detectable in upper and lower respiratory specimens during the acute phase of infection. The lowest concentration of SARS-CoV-2 viral copies this assay can detect is 250 copies / mL. A negative result  does not preclude SARS-CoV-2 infection and should not be used as the sole basis for treatment or other patient management decisions.  A negative result may occur with improper specimen collection / handling, submission of specimen other than nasopharyngeal swab, presence of viral mutation(s) within the areas targeted by this assay, and inadequate number of viral copies (<250 copies / mL). A negative result must be combined with clinical observations, patient history, and epidemiological information.  Fact Sheet for Patients:   BoilerBrush.com.cy  Fact Sheet for Healthcare Providers: https://pope.com/  This test is not yet approved or  cleared by the [place] FDA and has been authorized for detection and/or diagnosis of SARS-CoV-2 by FDA under an Emergency Use  Authorization (EUA).  This EUA will remain in effect (meaning this test can be used) for the duration of the COVID-19 declaration under Section 564(b)(1) of the Act, 21 U.S.C. section 360bbb-3(b)(1), unless the authorization is terminated or revoked sooner.  Performed at [facility], [address] [place], [place] [address]   Blood culture (routine x 2)     Status: None (Preliminary result)   Collection Time: 11/30/19 11:00 PM   Specimen: BLOOD  Result Value Ref Range Status   Specimen Description   Final    BLOOD LEFT ANTECUBITAL Performed at [facility], [address] [place], [place] [address]    Special Requests   Final    BOTTLES DRAWN AEROBIC AND ANAEROBIC Blood Culture results may not be optimal due to an inadequate volume of blood received in culture bottles Performed at [facility], [address] [place], [place] [address]    Culture   Final    NO GROWTH 1 DAY Performed at [facility], [address] [place], [place] [address]    Report Status PENDING  Incomplete  Blood culture (routine  x 2)     Status: None (Preliminary result)   Collection Time: 11/30/19 11:00 PM   Specimen: BLOOD  Result Value Ref Range Status   Specimen Description   Final    BLOOD RIGHT ANTECUBITAL Performed at [facility], [address] [place], [place] [address]    Special Requests   Final    BOTTLES DRAWN AEROBIC AND ANAEROBIC Blood Culture adequate volume Performed at [facility], [address] [place], [place] [address]    Culture   Final    NO GROWTH 1 DAY Performed at [facility], [address] [place], [place] [address]    Report Status PENDING  Incomplete  MRSA PCR Screening     Status: Abnormal   Collection Time: 12/01/19  9:30 AM   Specimen: Nasal Mucosa; Nasopharyngeal  Result Value Ref Range Status   MRSA by PCR POSITIVE (A) NEGATIVE Final    Comment:        The GeneXpert MRSA Assay (FDA approved for NASAL specimens only), is one component of a comprehensive MRSA colonization surveillance program. It is not intended to diagnose MRSA infection nor to guide or monitor treatment for MRSA infections. CRITICAL RESULT CALLED TO, READ BACK BY AND VERIFIED WITH: FAULK,M @ 1159 ON [id number] BY POTEAT,S Performed at [facility], [address] [place], [place] [address]          Radiology Studies: DG Chest 1 View  Result Date: 11/30/2019 CLINICAL DATA:  Nausea, vomiting EXAM: CHEST  1 VIEW COMPARISON:  None. FINDINGS: The heart size and mediastinal contours are within normal limits. Both lungs are clear. The visualized skeletal structures are unremarkable. IMPRESSION: No active disease. Electronically Signed   By: Helyn NumbersAshesh  Parikh MD   On: 11/30/2019 23:12        Scheduled Meds: . Chlorhexidine Gluconate Cloth  6 each Topical Q0600  . enoxaparin (LOVENOX) injection  40 mg Subcutaneous Q24H  . gabapentin  100 mg Oral Daily  . insulin aspart  0-5 Units Subcutaneous QHS  . insulin aspart  0-6 Units  Subcutaneous TID WC  . insulin glargine  14 Units Subcutaneous Daily  . mometasone-formoterol  2 puff Inhalation BID  . montelukast  10 mg Oral QHS  . mupirocin ointment  1 application Nasal BID  .  pantoprazole (PROTONIX) IV  40 mg Intravenous Q24H   Continuous Infusions: . sodium chloride       LOS: 2 days     Alwyn Ren, MD 12/02/2019, 1:50 PM

## 2019-12-02 NOTE — Progress Notes (Signed)
Inpatient Diabetes Program Recommendations  AACE/ADA: New Consensus Statement on Inpatient Glycemic Control (2015)  Target Ranges:  Prepandial:   less than 140 mg/dL      Peak postprandial:   less than 180 mg/dL (1-2 hours)      Critically ill patients:  140 - 180 mg/dL   Lab Results  Component Value Date   GLUCAP 292 (H) 12/02/2019    Review of Glycemic Control  Diabetes history: DM2 Outpatient Diabetes medications: tradjenta 5 mg QAM, metformin 500 mg bid Current orders for Inpatient glycemic control: Lantus 14 units QD, Novolog 0-6 units tidwc and 0-5 units QHS  HgbA1C - 15.3% - poor glycemic control  Inpatient Diabetes Program Recommendations:     Needs meal coverage insulin - Novolog 3 units tidwc  Will teach insulin pen administration and continue diabetes education this am.   Continue to follow.  Thank you. Ailene Ards, RD, LDN, CDE Inpatient Diabetes Coordinator [phone]

## 2019-12-03 LAB — COMPREHENSIVE METABOLIC PANEL
ALT: 10 U/L (ref 0–44)
AST: 8 U/L — ABNORMAL LOW (ref 15–41)
Albumin: 3.1 g/dL — ABNORMAL LOW (ref 3.5–5.0)
Alkaline Phosphatase: 59 U/L (ref 38–126)
Anion gap: 12 (ref 5–15)
BUN: 10 mg/dL (ref 6–20)
CO2: 21 mmol/L — ABNORMAL LOW (ref 22–32)
Calcium: 8.9 mg/dL (ref 8.9–10.3)
Chloride: 101 mmol/L (ref 98–111)
Creatinine, Ser: 0.67 mg/dL (ref 0.44–1.00)
GFR calc Af Amer: >60 mL/min (ref >60)
GFR calc non Af Amer: >60 mL/min (ref >60)
Glucose, Bld: 292 mg/dL — ABNORMAL HIGH (ref 70–99)
Potassium: 3.8 mmol/L (ref 3.5–5.1)
Sodium: 134 mmol/L — ABNORMAL LOW (ref 135–145)
Total Bilirubin: 1 mg/dL (ref 0.3–1.2)
Total Protein: 7.2 g/dL (ref 6.5–8.1)

## 2019-12-03 LAB — GLUCOSE, CAPILLARY
Glucose-Capillary: 286 mg/dL — ABNORMAL HIGH (ref 70–99)
Glucose-Capillary: 143 mg/dL — ABNORMAL HIGH (ref 70–99)

## 2019-12-03 MED ORDER — TRADJENTA 5 MG PO TABS: 5.0000 mg | ORAL_TABLET | Freq: Every morning | ORAL | 2 refills | Status: AC

## 2019-12-03 MED ORDER — PANTOPRAZOLE SODIUM 40 MG PO TBEC: 40.0000 mg | DELAYED_RELEASE_TABLET | Freq: Every day | ORAL | 2 refills | Status: DC

## 2019-12-03 MED ORDER — ALUM & MAG HYDROXIDE-SIMETH 200-200-20 MG/5ML PO SUSP
15.0000 mL | ORAL | Status: DC | PRN
  Administered 2019-12-03: 15 mL via ORAL
  Filled 2019-12-03: qty 30

## 2019-12-03 MED ORDER — METFORMIN HCL ER 500 MG PO TB24: 500.0000 mg | ORAL_TABLET | Freq: Two times a day (BID) | ORAL | 2 refills | Status: AC

## 2019-12-03 MED ORDER — VENTOLIN HFA 108 (90 BASE) MCG/ACT IN AERS: 2.0000 | INHALATION_SPRAY | RESPIRATORY_TRACT | 2 refills | Status: AC | PRN

## 2019-12-03 MED ORDER — BLOOD GLUCOSE METER KIT: PACK | 0 refills | Status: AC

## 2019-12-03 MED ORDER — NOVOLOG FLEXPEN 100 UNIT/ML ~~LOC~~ SOPN: 8.0000 [IU] | PEN_INJECTOR | Freq: Three times a day (TID) | SUBCUTANEOUS | 11 refills | Status: AC

## 2019-12-03 MED ORDER — "PEN NEEDLES 3/16"" 31G X 5 MM MISC": 8.0000 [IU] | Freq: Three times a day (TID) | 6 refills | Status: AC

## 2019-12-03 MED ORDER — LEVEMIR FLEXTOUCH 100 UNIT/ML ~~LOC~~ SOPN: 15.0000 [IU] | PEN_INJECTOR | Freq: Two times a day (BID) | SUBCUTANEOUS | 11 refills | Status: AC

## 2019-12-03 MED ORDER — SYMBICORT 160-4.5 MCG/ACT IN AERO: 2.0000 | INHALATION_SPRAY | Freq: Two times a day (BID) | RESPIRATORY_TRACT | 12 refills | Status: AC

## 2019-12-03 NOTE — Care Management Important Message (Signed)
Important Message  Patient Details IM Letter given to the Patient Name: Kisa Fujii MRN: [id number] Date of Birth: [birth year]   Medicare Important Message Given:  Yes     Caren Macadam 12/03/2019, 10:47 AM

## 2019-12-03 NOTE — TOC Transition Note (Signed)
Transition of Care [facility]) - CM/SW Discharge Note   Patient Details  Name: Jenna Frost MRN: [id number] Date of Birth: [birth year]  Transition of Care [name]) CM/SW Contact:  Ida Rogue, LCSW Phone Number: 12/03/2019, 9:59 AM   Clinical Narrative:   [name], who lives with her daughter and 5 grandkids, will be discharging today.  She confirms she has MCR and MCD, so meds are affordable.  She also confirms she needs a PCP appointment and a ride home.  CSW can supply both.  TOC sign off.    Final next level of care: Home/Self Care Barriers to Discharge: Barriers Resolved   Patient Goals and CMS Choice Patient states their goals for this hospitalization and ongoing recovery are:: to go home CMS Medicare.gov Compare Post Acute Care list provided to:: Patient    Discharge Placement                       Discharge Plan and Services                                     Social Determinants of Health (SDOH) Interventions     Readmission Risk Interventions No flowsheet data found.

## 2019-12-03 NOTE — Discharge Summary (Signed)
Physician Discharge Summary  Jenna Frost VGK:[id number] DOB: [birth year] DOA: 11/30/2019  PCP: Patient, No Pcp Per  Admit date: 11/30/2019 Discharge date: 12/03/2019  Admitted From: home Disposition:  home Recommendations for Outpatient Follow-up:  1. Follow up with PCP in 1-2 weeks 2. Please obtain BMP/CBC in one week  Home Health none Equipment/Devices none  Discharge Condition stable CODE STATUS full Diet recommendation:carb modified Brief/Interim Summary:49 year old female with history of type 2 diabetes and hypertension and asthma recently moved into the area from [place] ran out of her medications and is admitted with hypoglycemia with CBGs in the 500s.  Upon arrival to the ER her blood sugar was 713 with a bicarb of 11 and a gap of 34.  Discharge Diagnoses:  Principal Problem:   DKA (diabetic ketoacidoses) ([place]) Active Problems:   Asthma   Renal insufficiency   SIRS (systemic inflammatory response syndrome) ([place])  #1 DKA patient admitted with hyperglycemia glucose of 713 and presence of urine ketones and elevated gap of 34 which normalized. A1c 15.3  Will continue Tradjenta Metformin Levemir and novolog Seen by diabetic coordinator   #2 AKI -resolved with IVF   #3 history of asthma continue inhalers Dulera, Singulair  #4 essential hypertension-CONTINUE ace inhibitor  #5 SIRS-at the time of admission patient was tachypneic tachycardic with leukocytosis and lactic acidosis.  Patient was afebrile.  There was no focal infection present on admission.  Blood cultures done in the ER.  Lactic acid improving to 3.4 from 5.     Estimated body mass index is 30.55 kg/m as calculated from the following:   Height as of this encounter: _0  (1.626 m).   Weight as of this encounter: 80.7 kg.  Discharge Instructions  Discharge Instructions    Diet - low sodium heart healthy   Complete by: As directed    Increase activity slowly   Complete by: As directed       Allergies as of 12/03/2019   No Known Allergies     Medication List    TAKE these medications   blood glucose meter kit and supplies Dispense based on patient and insurance preference. Use up to four times daily as directed. (FOR ICD-10 E10.9, E11.9).   gabapentin 100 MG capsule Commonly known as: NEURONTIN Take 100 mg by mouth daily.   ipratropium-albuterol 0.5-2.5 (3) MG/3ML Soln Commonly known as: DUONEB Take 3 mLs by nebulization 4 (four) times daily as needed (shortness  of breath).   Levemir FlexTouch 100 UNIT/ML FlexPen Generic drug: insulin detemir Inject 15 Units into the skin 2 (two) times daily.   losartan 25 MG tablet Commonly known as: COZAAR Take 25 mg by mouth daily.   MAGnesium-Oxide 400 (241.3 Mg) MG tablet Generic drug: magnesium oxide Take 400 mg by mouth daily.   medroxyPROGESTERone 10 MG tablet Commonly known as: PROVERA Take 10 mg by mouth daily.   metFORMIN 500 MG 24 hr tablet Commonly known as: GLUCOPHAGE-XR Take 1 tablet (500 mg total) by mouth 2 (two) times daily.   montelukast 10 MG tablet Commonly known as: SINGULAIR Take 10 mg by mouth daily.   NovoLOG FlexPen 100 UNIT/ML FlexPen Generic drug: insulin aspart Inject 8 Units into the skin 3 (three) times daily with meals.   pantoprazole 40 MG tablet Commonly known as: PROTONIX Take 1 tablet (40 mg total) by mouth daily.   Pen Needles 3/16" 31G X 5 MM Misc 8 Units by Does not apply route in the morning, at noon, and at bedtime.  Symbicort 160-4.5 MCG/ACT inhaler Generic drug: budesonide-formoterol Inhale 2 puffs into the lungs 2 (two) times daily.   Tradjenta 5 MG Tabs tablet Generic drug: linagliptin Take 1 tablet (5 mg total) by mouth every morning.   Ventolin HFA 108 (90 Base) MCG/ACT inhaler Generic drug: albuterol Inhale 2 puffs into the lungs as needed for wheezing or shortness of breath.       Follow-up [place] Follow up on  12/08/2019.   Specialty: Internal Medicine Why: Wednesday at [facility] for your hospital follow up appointment Contact information: [place] [phone] [place] [phone]             No Known Allergies  Consultations:  none   Procedures/Studies: DG Chest 1 View  Result Date: 11/30/2019 CLINICAL DATA:  Nausea, vomiting EXAM: CHEST  1 VIEW COMPARISON:  None. FINDINGS: The heart size and mediastinal contours are within normal limits. Both lungs are clear. The visualized skeletal structures are unremarkable. IMPRESSION: No active disease. Electronically Signed   By: Fidela Salisbury MD   On: 11/30/2019 23:12    (Echo, Carotid, EGD, Colonoscopy, ERCP)    Subjective: No complaints   Discharge Exam: Vitals:   12/03/19 0121 12/03/19 0459  BP: 117/89 132/88  Pulse: 100 98  Resp: 16 16  Temp: 98.6 F (37 C) 98.6 F (37 C)  SpO2: 99% 99%   Vitals:   12/02/19 1500 12/02/19 2129 12/03/19 0121 12/03/19 0459  BP:  99/77 117/89 132/88  Pulse:  97 100 98  Resp: _0 Temp:  97.6 F (36.4 C) 98.6 F (37 C) 98.6 F (37 C)  TempSrc:      SpO2:  100% 99% 99%  Weight:      Height:        General: Pt is alert, awake, not in acute distress Cardiovascular: RRR, S1/S2 +, no rubs, no gallops Respiratory: CTA bilaterally, no wheezing, no rhonchi Abdominal: Soft, NT, ND, bowel sounds + Extremities: no edema, no cyanosis    The results of significant diagnostics from this hospitalization (including imaging, microbiology, ancillary and laboratory) are listed below for reference.     Microbiology: Recent Results (from the past 240 hour(s))  SARS Coronavirus 2 by RT PCR (hospital order, performed in [facility] hospital lab) Nasopharyngeal Nasopharyngeal Swab     Status: None   Collection Time: 11/30/19 11:00 PM   Specimen: Nasopharyngeal Swab  Result Value Ref Range Status   SARS Coronavirus 2 NEGATIVE NEGATIVE Final    Comment:  (NOTE) SARS-CoV-2 target nucleic acids are NOT DETECTED.  The SARS-CoV-2 RNA is generally detectable in upper and lower respiratory specimens during the acute phase of infection. The lowest concentration of SARS-CoV-2 viral copies this assay can detect is 250 copies / mL. A negative result does not preclude SARS-CoV-2 infection and should not be used as the sole basis for treatment or other patient management decisions.  A negative result may occur with improper specimen collection / handling, submission of specimen other than nasopharyngeal swab, presence of viral mutation(s) within the areas targeted by this assay, and inadequate number of viral copies (<250 copies / mL). A negative result must be combined with clinical observations, patient history, and epidemiological information.  Fact Sheet for Patients:   StrictlyIdeas.no  Fact Sheet for Healthcare Providers: BankingDealers.co.za  This test is not yet approved or  cleared by the [place] FDA and has been authorized for detection  and/or diagnosis of SARS-CoV-2 by FDA under an Emergency Use Authorization (EUA).  This EUA will remain in effect (meaning this test can be used) for the duration of the COVID-19 declaration under Section 564(b)(1) of the Act, 21 U.S.C. section 360bbb-3(b)(1), unless the authorization is terminated or revoked sooner.  Performed at [facility], [place] [address], [place], [place] [address]   Blood culture (routine x 2)     Status: None (Preliminary result)   Collection Time: 11/30/19 11:00 PM   Specimen: BLOOD  Result Value Ref Range Status   Specimen Description   Final    BLOOD LEFT ANTECUBITAL Performed at [facility] [address] [place], [place] [address]    Special Requests   Final    BOTTLES DRAWN AEROBIC AND ANAEROBIC Blood Culture results may not be optimal due to an inadequate volume of blood  received in culture bottles Performed at [facility] [address] [place], [place] [address]    Culture   Final    NO GROWTH 2 DAYS Performed at [facility] [address] [place], [place] [address]    Report Status PENDING  Incomplete  Blood culture (routine x 2)     Status: None (Preliminary result)   Collection Time: 11/30/19 11:00 PM   Specimen: BLOOD  Result Value Ref Range Status   Specimen Description   Final    BLOOD RIGHT ANTECUBITAL Performed at [facility] [address] [place], [place] [address]    Special Requests   Final    BOTTLES DRAWN AEROBIC AND ANAEROBIC Blood Culture adequate volume Performed at [facility] [address] [place], [place] [address]    Culture   Final    NO GROWTH 2 DAYS Performed at [facility] [address], [place], [place] [address]    Report Status PENDING  Incomplete  MRSA PCR Screening     Status: Abnormal   Collection Time: 12/01/19  9:30 AM   Specimen: Nasal Mucosa; Nasopharyngeal  Result Value Ref Range Status   MRSA by PCR POSITIVE (A) NEGATIVE Final    Comment:        The GeneXpert MRSA Assay (FDA approved for NASAL specimens only), is one component of a comprehensive MRSA colonization surveillance program. It is not intended to diagnose MRSA infection nor to guide or monitor treatment for MRSA infections. CRITICAL RESULT CALLED TO, READ BACK BY AND VERIFIED WITH: FAULK,M @ [id number] MO [id number] BY POTEAT,S Performed at [facility], [place] [address], [place], [place] [address]      Labs: BNP (last 3 results) No results for input(s): BNP in the last 8760 hours. Basic Metabolic Panel: Recent Labs  Lab 12/01/19 0947 12/01/19 1428 12/02/19 0644 12/02/19 1537 12/02/19 1639 12/03/19 0402  NA 137 138 138 132*  --  134*  K 3.6 3.5 4.2 2.7*  --  3.8  CL 103 104 102 100  --  101  CO2 18* 20* 19* 21*  --  21*  GLUCOSE 241*  165* 305* 307*  --  292*  BUN 24* 24* 14 11  --  10  CREATININE 1.18* 0.96 0.81 0.81  --  0.67  CALCIUM 8.8* 8.7* 9.2 8.3*  --  8.9  MG  --   --   --   --  1.6*  --    Liver Function Tests: Recent Labs  Lab 11/30/19 1900 12/02/19 1537 12/03/19 0402  AST 9* 10* 8*  ALT _0 ALKPHOS 93 64 59  BILITOT 1.8* 1.1 1.0  PROT 10.1* 7.3 7.2  ALBUMIN 4.7 3.2* 3.1*   Recent Labs  Lab 11/30/19 1900  LIPASE 18   No results for input(s): AMMONIA in the last 168 hours. CBC: Recent Labs  Lab 11/30/19 1900 12/02/19 1537  WBC 13.5* 9.2  NEUTROABS 10.8*  --   HGB 13.3 10.3*  HCT 41.4 31.3*  MCV 87.7 84.4  PLT 786* 552*   Cardiac Enzymes: No results for input(s): CKTOTAL, CKMB, CKMBINDEX, TROPONINI in the last 168 hours. BNP: Invalid input(s): POCBNP CBG: Recent Labs  Lab 12/02/19 0339 12/02/19 0844 12/02/19 1231 12/02/19 2132 12/03/19 0734  GLUCAP 272* 292* 322* 283* 286*   D-Dimer No results for input(s): DDIMER in the last 72 hours. Hgb A1c Recent Labs    12/01/19 0616  HGBA1C 15.3*   Lipid Profile No results for input(s): CHOL, HDL, LDLCALC, TRIG, CHOLHDL, LDLDIRECT in the last 72 hours. Thyroid function studies No results for input(s): TSH, T4TOTAL, T3FREE, THYROIDAB in the last 72 hours.  Invalid input(s): FREET3 Anemia work up No results for input(s): VITAMINB12, FOLATE, FERRITIN, TIBC, IRON, RETICCTPCT in the last 72 hours. Urinalysis    Component Value Date/Time   COLORURINE YELLOW 11/30/2019 2300   APPEARANCEUR CLEAR 11/30/2019 2300   LABSPEC 1.016 11/30/2019 2300   PHURINE 5.0 11/30/2019 2300   GLUCOSEU >=500 (A) 11/30/2019 2300   HGBUR SMALL (A) 11/30/2019 2300   BILIRUBINUR NEGATIVE 11/30/2019 2300   KETONESUR 80 (A) 11/30/2019 2300   PROTEINUR 30 (A) 11/30/2019 2300   NITRITE NEGATIVE 11/30/2019 2300   LEUKOCYTESUR TRACE (A) 11/30/2019 2300   Sepsis Labs Invalid input(s): PROCALCITONIN,  WBC,  LACTICIDVEN Microbiology Recent Results  (from the past 240 hour(s))  SARS Coronavirus 2 by RT PCR (hospital order, performed in [facility] hospital lab) Nasopharyngeal Nasopharyngeal Swab     Status: None   Collection Time: 11/30/19 11:00 PM   Specimen: Nasopharyngeal Swab  Result Value Ref Range Status   SARS Coronavirus 2 NEGATIVE NEGATIVE Final    Comment: (NOTE) SARS-CoV-2 target nucleic acids are NOT DETECTED.  The SARS-CoV-2 RNA is generally detectable in upper and lower respiratory specimens during the acute phase of infection. The lowest concentration of SARS-CoV-2 viral copies this assay can detect is 250 copies / mL. A negative result does not preclude SARS-CoV-2 infection and should not be used as the sole basis for treatment or other patient management decisions.  A negative result may occur with improper specimen collection / handling, submission of specimen other than nasopharyngeal swab, presence of viral mutation(s) within the areas targeted by this assay, and inadequate number of viral copies (<250 copies / mL). A negative result must be combined with clinical observations, patient history, and epidemiological information.  Fact Sheet for Patients:   StrictlyIdeas.no  Fact Sheet for Healthcare Providers: BankingDealers.co.za  This test is not yet approved or  cleared by the [place] FDA and has been authorized for detection and/or diagnosis of SARS-CoV-2 by FDA under an Emergency Use Authorization (EUA).  This EUA will remain in effect (meaning this test can be used) for the duration of the COVID-19 declaration under Section 564(b)(1) of the Act, 21 U.S.C. section 360bbb-3(b)(1), unless the authorization is terminated or revoked sooner.  Performed at [facility], [place] [address], [place], [place] [address]   Blood culture (routine x 2)     Status: None (Preliminary result)  Collection Time: 11/30/19 11:00 PM   Specimen: BLOOD   Result Value Ref Range Status   Specimen Description   Final    BLOOD LEFT ANTECUBITAL Performed at [facility] [address] [place], [place] [address]    Special Requests   Final    BOTTLES DRAWN AEROBIC AND ANAEROBIC Blood Culture results may not be optimal due to an inadequate volume of blood received in culture bottles Performed at [facility] [address], [place], [place] [address]    Culture   Final    NO GROWTH 2 DAYS Performed at [facility] [address] [place], [place] [address]    Report Status PENDING  Incomplete  Blood culture (routine x 2)     Status: None (Preliminary result)   Collection Time: 11/30/19 11:00 PM   Specimen: BLOOD  Result Value Ref Range Status   Specimen Description   Final    BLOOD RIGHT ANTECUBITAL Performed at [facility] [address], [place], [place] [address]    Special Requests   Final    BOTTLES DRAWN AEROBIC AND ANAEROBIC Blood Culture adequate volume Performed at [facility] [address], [place], [place] [address]    Culture   Final    NO GROWTH 2 DAYS Performed at [facility] [address], [place], [place] [address]    Report Status PENDING  Incomplete  MRSA PCR Screening     Status: Abnormal   Collection Time: 12/01/19  9:30 AM   Specimen: Nasal Mucosa; Nasopharyngeal  Result Value Ref Range Status   MRSA by PCR POSITIVE (A) NEGATIVE Final    Comment:        The GeneXpert MRSA Assay (FDA approved for NASAL specimens only), is one component of a comprehensive MRSA colonization surveillance program. It is not intended to diagnose MRSA infection nor to guide or monitor treatment for MRSA infections. CRITICAL RESULT CALLED TO, READ BACK BY AND VERIFIED WITH: FAULK,M @ [id number] SW [id number] BY POTEAT,S Performed at [facility], [place] [address], [place], [place] [address]      Time coordinating  discharge: 39 minutes  SIGNED:   Georgette Shell, MD  [facility] 12/03/2019, 10:36 AM  If 7PM-7AM, please contact night-coverage [email or web address] Password [email or web address]

## 2019-12-03 NOTE — Progress Notes (Signed)
Inpatient Diabetes Program Recommendations  AACE/ADA: New Consensus Statement on Inpatient Glycemic Control (2015)  Target Ranges:  Prepandial:   less than 140 mg/dL      Peak postprandial:   less than 180 mg/dL (1-2 hours)      Critically ill patients:  140 - 180 mg/dL   Lab Results  Component Value Date   GLUCAP 286 (H) 12/03/2019   HGBA1C 15.3 (H) 12/01/2019    Review of Glycemic Control  FBS this am 292, 286.  Inpatient Diabetes Program Recommendations:     Increase Lantus to 15 units bid  Continue to follow closely.  Thank you. Ailene Ards, RD, LDN, CDE Inpatient Diabetes Coordinator [phone]

## 2019-12-06 LAB — CULTURE, BLOOD (ROUTINE X 2)
Culture: NO GROWTH
Culture: NO GROWTH
Special Requests: ADEQUATE

## 2019-12-08 ENCOUNTER — Other Ambulatory Visit: Payer: Self-pay

## 2019-12-08 ENCOUNTER — Encounter: Payer: Self-pay | Admitting: Nurse Practitioner

## 2019-12-08 ENCOUNTER — Ambulatory Visit (INDEPENDENT_AMBULATORY_CARE_PROVIDER_SITE_OTHER): Payer: Medicare Other | Admitting: Nurse Practitioner

## 2019-12-08 VITALS — BP 128/79 | HR 104 | Temp 98.4°F | Ht 64.0 in | Wt 167.0 lb

## 2019-12-08 DIAGNOSIS — Z1159 Encounter for screening for other viral diseases: Secondary | ICD-10-CM | POA: Diagnosis not present

## 2019-12-08 DIAGNOSIS — K219 Gastro-esophageal reflux disease without esophagitis: Secondary | ICD-10-CM

## 2019-12-08 DIAGNOSIS — N898 Other specified noninflammatory disorders of vagina: Secondary | ICD-10-CM

## 2019-12-08 DIAGNOSIS — D649 Anemia, unspecified: Secondary | ICD-10-CM

## 2019-12-08 DIAGNOSIS — N289 Disorder of kidney and ureter, unspecified: Secondary | ICD-10-CM | POA: Diagnosis not present

## 2019-12-08 DIAGNOSIS — E1165 Type 2 diabetes mellitus with hyperglycemia: Secondary | ICD-10-CM | POA: Diagnosis not present

## 2019-12-08 DIAGNOSIS — F331 Major depressive disorder, recurrent, moderate: Secondary | ICD-10-CM

## 2019-12-08 LAB — POCT URINALYSIS DIPSTICK (MANUAL)
Nitrite, UA: NEGATIVE
Poct Blood: NEGATIVE
Poct Glucose: NORMAL mg/dL
Poct Urobilinogen: NORMAL mg/dL
Spec Grav, UA: >=1.03 — AB (ref 1.010–1.025)
pH, UA: 5.5 (ref 5.0–8.0)

## 2019-12-08 MED ORDER — FLUCONAZOLE 150 MG PO TABS: 150.0000 mg | ORAL_TABLET | Freq: Once | ORAL | 0 refills | Status: AC

## 2019-12-08 MED ORDER — ESCITALOPRAM OXALATE 10 MG PO TABS: 10.0000 mg | ORAL_TABLET | Freq: Every day | ORAL | 11 refills | Status: AC

## 2019-12-08 MED ORDER — SUCRALFATE 1 G PO TABS: 1.0000 g | ORAL_TABLET | Freq: Three times a day (TID) | ORAL | 0 refills | Status: DC

## 2019-12-08 MED ORDER — PANTOPRAZOLE SODIUM 40 MG PO TBEC: 40.0000 mg | DELAYED_RELEASE_TABLET | Freq: Every day | ORAL | 3 refills | Status: DC

## 2019-12-08 MED ORDER — DEXILANT 60 MG PO CPDR: 60.0000 mg | DELAYED_RELEASE_CAPSULE | Freq: Every day | ORAL | 11 refills | Status: AC

## 2019-12-08 NOTE — Progress Notes (Signed)
[address] [place], [place]  [address] Phone:  [phone]   Fax:  [phone]    New Patient Office Visit  Subjective:  Patient ID: Jenna Frost, female    DOB: 20-Aug-1970  Age: 49 y.o. MRN: [id number]  CC:  Chief Complaint  Patient presents with  . Follow-up    hospital discharge    HPI Jenna Frost presents to establish care. She  has a past medical history of Anemia, Asthma, and Diabetes mellitus without complication ([place]).   Diabetes Mellitus Patient presents for follow up of diabetes. Current symptoms include: hyperglycemia, nausea, paresthesia of the feet and visual disturbances. Symptoms have stabilized. Patient denies foot ulcerations, hypoglycemia , increased appetite, polydipsia, polyuria, vomiting and weight loss. Evaluation to date has included: fasting blood sugar, fasting lipid panel, hemoglobin A1C and microalbuminuria.  Home sugars: BGs are high in the morning 232 this morning and then it went 312. She admits that her morning has been as low as 62. Current treatment: Continued insulin which has been not very effective, Continued metformin which has been not very effective and Continued ACE inhibitor/ARB which has been effective DPP-4 inhibitor. Last dilated eye exam: 2018.  She admits that she is SP COV-19 smell and taste which was significant.   GERD Paitent complains of heartburn. This has been associated with difficulty swallowing, heartburn, midespigastric pain, nausea and nocturnal burning.  She denies choking on food, deep pressure at base of neck, hematemesis, hoarseness, laryngitis, melena, regurgitation of undigested food, shortness of breath and wheezing. Symptoms have been present for several weeks. She has had dysphagia for solids. She has not lost weight.She lost weight in the past but this was intentional . She feels like she may have gained recently  She denies melena, hematochezia, hematemesis, and coffee ground  emesis. Medical therapy in the past has included proton pump inhibitors. She does not feel like the Protonix is effective now. She has tried several in the past.   Asthma Patient presents for evaluation of dyspnea. The patient has been previously diagnosed with asthma. Symptoms currently include dyspnea and occasional wheezing and occur daily. Observed precipitants include: no identifiable factor. Current limitations in activity from asthma: none. Does she do nebulizer treatments? yes Does she use an inhaler? yes Does she use a spacer with MDIs? no  Vaginitis Patient presents for evaluation of an abnormal vaginal discharge. Symptoms have been present for several days. Vaginal symptoms: local irritation. Contraception: none. She denies blisters, bumps, burning, dyspareunia, lesions, odor, post coital bleeding, tears, urinary symptoms of dysuria and warts. Sexually transmitted infection risk: very low risk of STD exposure. Menstrual flow: irregular.  Irregular Menstruation Patient complains of irregular menses. Patient's last menstrual period was 11/21/2019.  Periods are irregular, lasting 2 weeks at times. Dysmenorrhea:moderate, occurring throughout menses. Cyclic symptoms include: depression, moodiness and pelvic pain. Current contraception: none. History of infertility: no. History of abnormal Pap smear: no. She admits that during her cycle the bleeding is so heavy she does not want to get out of bed.     Past Medical History:  Diagnosis Date  . Anemia   . Asthma   . Diabetes mellitus without complication [facility])     Past Surgical History:  Procedure Laterality Date  . CHOLECYSTECTOMY      Family History  Problem Relation Age of Onset  . Hypertension Mother   . Asthma Father     Social History   Socioeconomic History  . Marital status:  Single    Spouse name: Not on file  . Number of children: 3  . Years of education: Not on file  . Highest education level: Not on file    Occupational History  . Not on file  Tobacco Use  . Smoking status: Never Smoker  . Smokeless tobacco: Never Used  Vaping Use  . Vaping Use: Never used  Substance and Sexual Activity  . Alcohol use: Not Currently  . Drug use: Not Currently  . Sexual activity: Not on file  Other Topics Concern  . Not on file  Social History Narrative  . Not on file   Social Determinants of Health   Financial Resource Strain:   . Difficulty of Paying Living Expenses:   Food Insecurity:   . Worried About Charity fundraiser in the Last Year:   . Arboriculturist in the Last Year:   Transportation Needs:   . Film/video editor (Medical):   Marland Kitchen Lack of Transportation (Non-Medical):   Physical Activity:   . Days of Exercise per Week:   . Minutes of Exercise per Session:   Stress:   . Feeling of Stress :   Social Connections:   . Frequency of Communication with Friends and Family:   . Frequency of Social Gatherings with Friends and Family:   . Attends Religious Services:   . Active Member of Clubs or Organizations:   . Attends Archivist Meetings:   Marland Kitchen Marital Status:   Intimate Partner Violence:   . Fear of Current or Ex-Partner:   . Emotionally Abused:   Marland Kitchen Physically Abused:   . Sexually Abused:     ROS Review of Systems  Eyes: Positive for visual disturbance.  Respiratory: Positive for shortness of breath.   Cardiovascular: Positive for leg swelling.  Gastrointestinal: Positive for nausea.  Genitourinary: Positive for vaginal discharge.  Musculoskeletal: Positive for arthralgias.       Knee   Neurological: Positive for numbness.       RLS   Psychiatric/Behavioral:       Down and out  lexapro yrs ago     Objective:   Today's Vitals: BP 128/79   Pulse (!) 104   Temp 98.4 F (36.9 C) (Temporal)   Ht '5\' 4"'  (1.626 m)   Wt 167 lb (75.8 kg)   LMP 11/21/2019   SpO2 99%   BMI 28.67 kg/m   Physical Exam HENT:     Head: Normocephalic and atraumatic.      Nose: Nose normal.     Mouth/Throat:     Mouth: Mucous membranes are moist.  Eyes:     Pupils: Pupils are equal, round, and reactive to light.     Comments: glasses  Cardiovascular:     Rate and Rhythm: Normal rate and regular rhythm.     Pulses: Normal pulses.          Dorsalis pedis pulses are 2+ on the right side and 2+ on the left side.       Posterior tibial pulses are 2+ on the right side and 2+ on the left side.     Heart sounds: Normal heart sounds.  Pulmonary:     Effort: Pulmonary effort is normal.     Breath sounds: Normal breath sounds.  Abdominal:     General: Bowel sounds are normal.     Palpations: Abdomen is soft.  Musculoskeletal:        General: Normal range of motion.  Cervical back: Normal range of motion.  Feet:     Right foot:     Protective Sensation: 10 sites tested. 10 sites sensed.     Skin integrity: Skin integrity normal.     Left foot:     Protective Sensation: 10 sites tested. 10 sites sensed.     Skin integrity: Skin integrity normal.  Skin:    General: Skin is warm and dry.     Capillary Refill: Capillary refill takes less than 2 seconds.  Neurological:     General: No focal deficit present.     Mental Status: She is alert and oriented to person, place, and time.  Psychiatric:        Behavior: Behavior normal.        Thought Content: Thought content normal.        Judgment: Judgment normal.     Assessment & Plan:   Problem List Items Addressed This Visit      Genitourinary   Renal insufficiency   Relevant Orders   POCT Urinalysis Dip Manual (Completed)   Comp. Metabolic Panel (12)    Other Visit Diagnoses    Uncontrolled type 2 diabetes mellitus with hyperglycemia ([place])    -  Primary Encourage compliance with current treatment regimen Encourage regular CBG monitoring Encourage contacting office if excessive hyperglycemia and or hypoglycemia Lifestyle modification with healthy diet (fewer calories, more high fiber foods, whole  grains and non-starchy vegetables, lower fat meat and fish, low-fat diary include healthy oils) regular exercise (physical activity) and weight loss Opthalmology exam discussed  Nutritional consult recommended Regular dental visits encouraged Home BP monitoring also encouraged goal <130/80 Lipid panel at next visit.     Relevant Orders   Comp. Metabolic Panel (12)   Microalbumin, urine   Anemia, unspecified type     Referral to GYN for evaluation of partial hysterectomy which was a disscusion with a previous provider in [place]    Relevant Orders   CBC with Differential/Platelet   Ambulatory referral to Gynecology   Need for hepatitis C screening test       Relevant Orders   Hepatitis C antibody   Vaginal irritation       Relevant Orders   NuSwab Vaginitis (VG)   Gastroesophageal reflux disease, unspecified whether esophagitis present     Would like to trial the Dexlansoprazole due to her increased pain and difficulty swallowing . However refilled the Protonix incase a PA is needed.    Relevant Medications   dexlansoprazole (DEXILANT) 60 MG capsule   pantoprazole (PROTONIX) 40 MG tablet   sucralfate (CARAFATE) 1 g tablet   Other Relevant Orders   Ambulatory referral to Gastroenterology      Outpatient Encounter Medications as of 12/08/2019  Medication Sig  . blood glucose meter kit and supplies Dispense based on patient and insurance preference. Use up to four times daily as directed. (FOR ICD-10 E10.9, E11.9).  Marland Kitchen gabapentin (NEURONTIN) 100 MG capsule Take 100 mg by mouth daily.  . insulin aspart (NOVOLOG FLEXPEN) 100 UNIT/ML FlexPen Inject 8 Units into the skin 3 (three) times daily with meals.  . insulin detemir (LEVEMIR FLEXTOUCH) 100 UNIT/ML FlexPen Inject 15 Units into the skin 2 (two) times daily.  . Insulin Pen Needle (PEN NEEDLES 3/16") 31G X 5 MM MISC 8 Units by Does not apply route in the morning, at noon, and at bedtime.  Marland Kitchen ipratropium-albuterol (DUONEB) 0.5-2.5  (3) MG/3ML SOLN Take 3 mLs by nebulization 4 (four) times daily as  needed (shortness  of breath).   . losartan (COZAAR) 25 MG tablet Take 25 mg by mouth daily.  Marland Kitchen MAGNESIUM-OXIDE 400 (241.3 Mg) MG tablet Take 400 mg by mouth daily.   . medroxyPROGESTERone (PROVERA) 10 MG tablet Take 10 mg by mouth daily.  . metFORMIN (GLUCOPHAGE-XR) 500 MG 24 hr tablet Take 1 tablet (500 mg total) by mouth 2 (two) times daily.  . montelukast (SINGULAIR) 10 MG tablet Take 10 mg by mouth daily.  . SYMBICORT 160-4.5 MCG/ACT inhaler Inhale 2 puffs into the lungs 2 (two) times daily.  . TRADJENTA 5 MG TABS tablet Take 1 tablet (5 mg total) by mouth every morning.  . VENTOLIN HFA 108 (90 Base) MCG/ACT inhaler Inhale 2 puffs into the lungs as needed for wheezing or shortness of breath.  . [DISCONTINUED] pantoprazole (PROTONIX) 40 MG tablet Take 1 tablet (40 mg total) by mouth daily.  . BD PEN NEEDLE NANO 2ND GEN 32G X 4 MM MISC SMARTSIG:Injection Daily  . dexlansoprazole (DEXILANT) 60 MG capsule Take 1 capsule (60 mg total) by mouth daily.  Marland Kitchen escitalopram (LEXAPRO) 10 MG tablet Take 1 tablet (10 mg total) by mouth daily.  . fluconazole (DIFLUCAN) 150 MG tablet Take 1 tablet (150 mg total) by mouth once for 1 dose.  . pantoprazole (PROTONIX) 40 MG tablet Take 1 tablet (40 mg total) by mouth daily.  . sucralfate (CARAFATE) 1 g tablet Take 1 tablet (1 g total) by mouth 4 (four) times daily -  with meals and at bedtime for 14 days.   No facility-administered encounter medications on file as of 12/08/2019.    Follow-up: Return in about 6 weeks (around 01/19/2020).   Vevelyn Francois, NP

## 2019-12-09 LAB — CBC WITH DIFFERENTIAL/PLATELET
Basophils Absolute: 0.1 10*3/uL (ref 0.0–0.2)
Basos: 1 %
EOS (ABSOLUTE): 0.3 10*3/uL (ref 0.0–0.4)
Eos: 3 %
Hematocrit: 34.4 % (ref 34.0–46.6)
Hemoglobin: 10.7 g/dL — ABNORMAL LOW (ref 11.1–15.9)
Immature Grans (Abs): 0.1 10*3/uL (ref 0.0–0.1)
Immature Granulocytes: 1 %
Lymphocytes Absolute: 3.6 10*3/uL — ABNORMAL HIGH (ref 0.7–3.1)
Lymphs: 35 %
MCH: 27 pg (ref 26.6–33.0)
MCHC: 31.1 g/dL — ABNORMAL LOW (ref 31.5–35.7)
MCV: 87 fL (ref 79–97)
Monocytes Absolute: 1.1 10*3/uL — ABNORMAL HIGH (ref 0.1–0.9)
Monocytes: 11 %
Neutrophils Absolute: 5.1 10*3/uL (ref 1.4–7.0)
Neutrophils: 49 %
Platelets: 777 10*3/uL — ABNORMAL HIGH (ref 150–450)
RBC: 3.96 x10E6/uL (ref 3.77–5.28)
RDW: 13.3 % (ref 11.7–15.4)
WBC: 10.3 10*3/uL (ref 3.4–10.8)

## 2019-12-09 LAB — COMP. METABOLIC PANEL (12)
AST: 11 IU/L (ref 0–40)
Albumin/Globulin Ratio: 1.3 (ref 1.2–2.2)
Albumin: 4.3 g/dL (ref 3.8–4.8)
Alkaline Phosphatase: 64 IU/L (ref 48–121)
BUN/Creatinine Ratio: 13 (ref 9–23)
BUN: 12 mg/dL (ref 6–24)
Bilirubin Total: <0.2 mg/dL (ref 0.0–1.2)
Calcium: 9.8 mg/dL (ref 8.7–10.2)
Chloride: 99 mmol/L (ref 96–106)
Creatinine, Ser: 0.9 mg/dL (ref 0.57–1.00)
GFR calc Af Amer: 89 mL/min/{1.73_m2} (ref >59)
GFR calc non Af Amer: 77 mL/min/{1.73_m2} (ref >59)
Globulin, Total: 3.3 g/dL (ref 1.5–4.5)
Glucose: 41 mg/dL — ABNORMAL LOW (ref 65–99)
Potassium: 3.7 mmol/L (ref 3.5–5.2)
Sodium: 140 mmol/L (ref 134–144)
Total Protein: 7.6 g/dL (ref 6.0–8.5)

## 2019-12-09 LAB — HEPATITIS C ANTIBODY: Hep C Virus Ab: <0.1 s/co ratio (ref 0.0–0.9)

## 2019-12-09 LAB — MICROALBUMIN, URINE: Microalbumin, Urine: 53.9 ug/mL

## 2019-12-11 ENCOUNTER — Other Ambulatory Visit: Payer: Self-pay | Admitting: Nurse Practitioner

## 2019-12-11 LAB — NUSWAB VAGINITIS (VG)
Atopobium vaginae: HIGH Score — AB
Candida albicans, NAA: NEGATIVE
Candida glabrata, NAA: NEGATIVE
Trich vag by NAA: NEGATIVE

## 2019-12-11 MED ORDER — METRONIDAZOLE 500 MG PO TABS: 500.0000 mg | ORAL_TABLET | Freq: Three times a day (TID) | ORAL | 0 refills | Status: AC

## 2020-01-19 ENCOUNTER — Ambulatory Visit: Payer: Medicare Other | Admitting: Nurse Practitioner

## 2020-01-28 ENCOUNTER — Other Ambulatory Visit: Payer: Self-pay | Admitting: Nurse Practitioner

## 2020-01-28 DIAGNOSIS — N898 Other specified noninflammatory disorders of vagina: Secondary | ICD-10-CM

## 2020-01-28 DIAGNOSIS — K219 Gastro-esophageal reflux disease without esophagitis: Secondary | ICD-10-CM

## 2020-04-02 ENCOUNTER — Other Ambulatory Visit: Payer: Self-pay

## 2020-04-02 ENCOUNTER — Emergency Department (HOSPITAL_COMMUNITY): Payer: Medicare Other

## 2020-04-02 ENCOUNTER — Observation Stay (HOSPITAL_COMMUNITY)
Admission: EM | Admit: 2020-04-02 | Discharge: 2020-04-04 | Disposition: A | Discharge status: Home / Self Care | Payer: Medicare Other | Attending: Internal Medicine | Admitting: Internal Medicine

## 2020-04-02 ENCOUNTER — Encounter (HOSPITAL_COMMUNITY): Payer: Self-pay | Admitting: *Deleted

## 2020-04-02 DIAGNOSIS — R0602 Shortness of breath: Secondary | ICD-10-CM | POA: Diagnosis not present

## 2020-04-02 DIAGNOSIS — Z20822 Contact with and (suspected) exposure to covid-19: Secondary | ICD-10-CM | POA: Insufficient documentation

## 2020-04-02 DIAGNOSIS — J45901 Unspecified asthma with (acute) exacerbation: Secondary | ICD-10-CM | POA: Diagnosis present

## 2020-04-02 DIAGNOSIS — R059 Cough, unspecified: Secondary | ICD-10-CM | POA: Diagnosis present

## 2020-04-02 DIAGNOSIS — Z79899 Other long term (current) drug therapy: Secondary | ICD-10-CM | POA: Diagnosis not present

## 2020-04-02 DIAGNOSIS — J4552 Severe persistent asthma with status asthmaticus: Secondary | ICD-10-CM | POA: Diagnosis not present

## 2020-04-02 DIAGNOSIS — Z794 Long term (current) use of insulin: Secondary | ICD-10-CM | POA: Diagnosis not present

## 2020-04-02 DIAGNOSIS — E876 Hypokalemia: Secondary | ICD-10-CM | POA: Diagnosis not present

## 2020-04-02 DIAGNOSIS — E119 Type 2 diabetes mellitus without complications: Secondary | ICD-10-CM | POA: Diagnosis not present

## 2020-04-02 NOTE — ED Triage Notes (Signed)
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

## 2020-04-03 ENCOUNTER — Emergency Department (HOSPITAL_COMMUNITY): Payer: Medicare Other

## 2020-04-03 ENCOUNTER — Other Ambulatory Visit: Payer: Self-pay

## 2020-04-03 ENCOUNTER — Encounter: Payer: Medicare Other | Admitting: Obstetrics & Gynecology

## 2020-04-03 ENCOUNTER — Observation Stay (HOSPITAL_COMMUNITY): Payer: Medicare Other

## 2020-04-03 DIAGNOSIS — Z794 Long term (current) use of insulin: Secondary | ICD-10-CM

## 2020-04-03 DIAGNOSIS — E876 Hypokalemia: Secondary | ICD-10-CM

## 2020-04-03 DIAGNOSIS — R55 Syncope and collapse: Secondary | ICD-10-CM

## 2020-04-03 DIAGNOSIS — J4552 Severe persistent asthma with status asthmaticus: Secondary | ICD-10-CM | POA: Diagnosis not present

## 2020-04-03 DIAGNOSIS — E119 Type 2 diabetes mellitus without complications: Secondary | ICD-10-CM

## 2020-04-03 DIAGNOSIS — J45901 Unspecified asthma with (acute) exacerbation: Secondary | ICD-10-CM | POA: Diagnosis not present

## 2020-04-03 LAB — CBC WITH DIFFERENTIAL/PLATELET
Abs Immature Granulocytes: 0.02 10*3/uL (ref 0.00–0.07)
Basophils Absolute: 0 10*3/uL (ref 0.0–0.1)
Basophils Relative: 0 %
Eosinophils Absolute: 0 10*3/uL (ref 0.0–0.5)
Eosinophils Relative: 0 %
HCT: 29.9 % — ABNORMAL LOW (ref 36.0–46.0)
Hemoglobin: 9.7 g/dL — ABNORMAL LOW (ref 12.0–15.0)
Immature Granulocytes: 0 %
Lymphocytes Relative: 19 %
Lymphs Abs: 1.3 10*3/uL (ref 0.7–4.0)
MCH: 26.1 pg (ref 26.0–34.0)
MCHC: 32.4 g/dL (ref 30.0–36.0)
MCV: 80.4 fL (ref 80.0–100.0)
Monocytes Absolute: 0.4 10*3/uL (ref 0.1–1.0)
Monocytes Relative: 5 %
Neutro Abs: 5.2 10*3/uL (ref 1.7–7.7)
Neutrophils Relative %: 76 %
Platelets: 385 10*3/uL (ref 150–400)
RBC: 3.72 MIL/uL — ABNORMAL LOW (ref 3.87–5.11)
RDW: 17.1 % — ABNORMAL HIGH (ref 11.5–15.5)
WBC: 7 10*3/uL (ref 4.0–10.5)
nRBC: 0 % (ref 0.0–0.2)

## 2020-04-03 LAB — COMPREHENSIVE METABOLIC PANEL
ALT: 12 U/L (ref 0–44)
AST: 14 U/L — ABNORMAL LOW (ref 15–41)
Albumin: 3.4 g/dL — ABNORMAL LOW (ref 3.5–5.0)
Alkaline Phosphatase: 50 U/L (ref 38–126)
Anion gap: 16 — ABNORMAL HIGH (ref 5–15)
BUN: 12 mg/dL (ref 6–20)
CO2: 19 mmol/L — ABNORMAL LOW (ref 22–32)
Calcium: 8.5 mg/dL — ABNORMAL LOW (ref 8.9–10.3)
Chloride: 101 mmol/L (ref 98–111)
Creatinine, Ser: 0.88 mg/dL (ref 0.44–1.00)
GFR, Estimated: >60 mL/min (ref >60)
Glucose, Bld: 375 mg/dL — ABNORMAL HIGH (ref 70–99)
Potassium: 2.7 mmol/L — CL (ref 3.5–5.1)
Sodium: 136 mmol/L (ref 135–145)
Total Bilirubin: 0.5 mg/dL (ref 0.3–1.2)
Total Protein: 6.9 g/dL (ref 6.5–8.1)

## 2020-04-03 LAB — D-DIMER, QUANTITATIVE: D-Dimer, Quant: 1.07 ug/mL-FEU — ABNORMAL HIGH (ref 0.00–0.50)

## 2020-04-03 LAB — CBG MONITORING, ED
Glucose-Capillary: 201 mg/dL — ABNORMAL HIGH (ref 70–99)
Glucose-Capillary: 316 mg/dL — ABNORMAL HIGH (ref 70–99)
Glucose-Capillary: 488 mg/dL — ABNORMAL HIGH (ref 70–99)
Glucose-Capillary: 456 mg/dL — ABNORMAL HIGH (ref 70–99)
Glucose-Capillary: 453 mg/dL — ABNORMAL HIGH (ref 70–99)
Glucose-Capillary: 173 mg/dL — ABNORMAL HIGH (ref 70–99)

## 2020-04-03 LAB — I-STAT CHEM 8, ED
BUN: 13 mg/dL (ref 6–20)
Calcium, Ion: 0.96 mmol/L — ABNORMAL LOW (ref 1.15–1.40)
Chloride: 103 mmol/L (ref 98–111)
Creatinine, Ser: 0.6 mg/dL (ref 0.44–1.00)
Glucose, Bld: 382 mg/dL — ABNORMAL HIGH (ref 70–99)
HCT: 31 % — ABNORMAL LOW (ref 36.0–46.0)
Hemoglobin: 10.5 g/dL — ABNORMAL LOW (ref 12.0–15.0)
Potassium: 2.7 mmol/L — CL (ref 3.5–5.1)
Sodium: 137 mmol/L (ref 135–145)
TCO2: 20 mmol/L — ABNORMAL LOW (ref 22–32)

## 2020-04-03 LAB — MAGNESIUM: Magnesium: 1.4 mg/dL — ABNORMAL LOW (ref 1.7–2.4)

## 2020-04-03 LAB — BASIC METABOLIC PANEL
Anion gap: 10 (ref 5–15)
BUN: 14 mg/dL (ref 6–20)
CO2: 20 mmol/L — ABNORMAL LOW (ref 22–32)
Calcium: 9.1 mg/dL (ref 8.9–10.3)
Chloride: 104 mmol/L (ref 98–111)
Creatinine, Ser: 0.8 mg/dL (ref 0.44–1.00)
GFR, Estimated: >60 mL/min (ref >60)
Glucose, Bld: 127 mg/dL — ABNORMAL HIGH (ref 70–99)
Potassium: 3.4 mmol/L — ABNORMAL LOW (ref 3.5–5.1)
Sodium: 134 mmol/L — ABNORMAL LOW (ref 135–145)

## 2020-04-03 LAB — HIV ANTIBODY (ROUTINE TESTING W REFLEX): HIV Screen 4th Generation wRfx: NONREACTIVE

## 2020-04-03 LAB — RESP PANEL BY RT-PCR (FLU A&B, COVID) ARPGX2
Influenza A by PCR: NEGATIVE
Influenza B by PCR: NEGATIVE
SARS Coronavirus 2 by RT PCR: NEGATIVE

## 2020-04-03 LAB — TROPONIN I (HIGH SENSITIVITY)
Troponin I (High Sensitivity): 3 ng/L (ref <18)
Troponin I (High Sensitivity): 15 ng/L (ref <18)

## 2020-04-03 LAB — CREATININE, SERUM
Creatinine, Ser: 1.03 mg/dL — ABNORMAL HIGH (ref 0.44–1.00)
GFR, Estimated: >60 mL/min (ref >60)

## 2020-04-03 LAB — HEMOGLOBIN A1C
Hgb A1c MFr Bld: 8.4 % — ABNORMAL HIGH (ref 4.8–5.6)
Mean Plasma Glucose: 194.38 mg/dL

## 2020-04-03 MED ORDER — PREDNISONE 20 MG PO TABS
60.0000 mg | ORAL_TABLET | Freq: Once | ORAL | Status: AC
  Administered 2020-04-03: 03:00:00 60 mg via ORAL
  Filled 2020-04-03: qty 3

## 2020-04-03 MED ORDER — ESCITALOPRAM OXALATE 10 MG PO TABS
10.0000 mg | ORAL_TABLET | Freq: Every day | ORAL | Status: DC
  Administered 2020-04-03 – 2020-04-04 (×2): 10 mg via ORAL
  Filled 2020-04-03 (×2): qty 1

## 2020-04-03 MED ORDER — ENOXAPARIN SODIUM 40 MG/0.4ML ~~LOC~~ SOLN
40.0000 mg | SUBCUTANEOUS | Status: DC
  Administered 2020-04-03 – 2020-04-04 (×2): 40 mg via SUBCUTANEOUS
  Filled 2020-04-03 (×2): qty 0.4

## 2020-04-03 MED ORDER — BENZONATATE 100 MG PO CAPS
100.0000 mg | ORAL_CAPSULE | Freq: Once | ORAL | Status: AC
  Administered 2020-04-03: 04:00:00 100 mg via ORAL
  Filled 2020-04-03: qty 1

## 2020-04-03 MED ORDER — IOHEXOL 350 MG/ML SOLN
60.0000 mL | Freq: Once | INTRAVENOUS | Status: AC | PRN
  Administered 2020-04-03: 10:00:00 60 mL via INTRAVENOUS

## 2020-04-03 MED ORDER — IPRATROPIUM-ALBUTEROL 0.5-2.5 (3) MG/3ML IN SOLN
3.0000 mL | RESPIRATORY_TRACT | Status: DC
  Administered 2020-04-03 – 2020-04-04 (×7): 3 mL via RESPIRATORY_TRACT
  Filled 2020-04-03 (×7): qty 3

## 2020-04-03 MED ORDER — INSULIN DETEMIR 100 UNIT/ML ~~LOC~~ SOLN
10.0000 [IU] | Freq: Two times a day (BID) | SUBCUTANEOUS | Status: DC
  Administered 2020-04-03: 10:00:00 10 [IU] via SUBCUTANEOUS
  Filled 2020-04-03 (×2): qty 0.1

## 2020-04-03 MED ORDER — PREDNISONE 20 MG PO TABS
40.0000 mg | ORAL_TABLET | Freq: Every day | ORAL | Status: DC
  Administered 2020-04-04: 40 mg via ORAL
  Filled 2020-04-03: qty 2

## 2020-04-03 MED ORDER — ALBUTEROL SULFATE (2.5 MG/3ML) 0.083% IN NEBU: 2.5000 mg | INHALATION_SOLUTION | RESPIRATORY_TRACT | Status: DC | PRN

## 2020-04-03 MED ORDER — INSULIN ASPART 100 UNIT/ML ~~LOC~~ SOLN
4.0000 [IU] | Freq: Three times a day (TID) | SUBCUTANEOUS | Status: DC
  Administered 2020-04-03: 13:00:00 4 [IU] via SUBCUTANEOUS

## 2020-04-03 MED ORDER — ZOLPIDEM TARTRATE 5 MG PO TABS
5.0000 mg | ORAL_TABLET | Freq: Every evening | ORAL | Status: DC | PRN
  Administered 2020-04-04: 02:00:00 5 mg via ORAL
  Filled 2020-04-03: qty 1

## 2020-04-03 MED ORDER — INSULIN ASPART 100 UNIT/ML ~~LOC~~ SOLN
0.0000 [IU] | Freq: Three times a day (TID) | SUBCUTANEOUS | Status: DC
  Administered 2020-04-03: 18:00:00 20 [IU] via SUBCUTANEOUS
  Administered 2020-04-03 – 2020-04-04 (×2): 4 [IU] via SUBCUTANEOUS

## 2020-04-03 MED ORDER — POTASSIUM CHLORIDE CRYS ER 20 MEQ PO TBCR: 40.0000 meq | EXTENDED_RELEASE_TABLET | Freq: Once | ORAL | Status: DC

## 2020-04-03 MED ORDER — LACTATED RINGERS IV BOLUS
1000.0000 mL | Freq: Once | INTRAVENOUS | Status: AC
  Administered 2020-04-03: 06:00:00 1000 mL via INTRAVENOUS

## 2020-04-03 MED ORDER — MONTELUKAST SODIUM 10 MG PO TABS
10.0000 mg | ORAL_TABLET | Freq: Every day | ORAL | Status: DC
  Administered 2020-04-03 – 2020-04-04 (×2): 10 mg via ORAL
  Filled 2020-04-03 (×3): qty 1

## 2020-04-03 MED ORDER — ALBUTEROL (5 MG/ML) CONTINUOUS INHALATION SOLN
10.0000 mg/h | INHALATION_SOLUTION | Freq: Once | RESPIRATORY_TRACT | Status: AC
  Administered 2020-04-03: 05:00:00 10 mg/h via RESPIRATORY_TRACT
  Filled 2020-04-03: qty 20

## 2020-04-03 MED ORDER — INSULIN DETEMIR 100 UNIT/ML ~~LOC~~ SOLN
12.0000 [IU] | Freq: Two times a day (BID) | SUBCUTANEOUS | Status: DC
  Administered 2020-04-03 – 2020-04-04 (×2): 12 [IU] via SUBCUTANEOUS
  Filled 2020-04-03 (×4): qty 0.12

## 2020-04-03 MED ORDER — ALBUTEROL SULFATE HFA 108 (90 BASE) MCG/ACT IN AERS
8.0000 | INHALATION_SPRAY | Freq: Once | RESPIRATORY_TRACT | Status: AC
  Administered 2020-04-03: 03:00:00 8 via RESPIRATORY_TRACT
  Filled 2020-04-03: qty 6.7

## 2020-04-03 MED ORDER — ALBUTEROL SULFATE HFA 108 (90 BASE) MCG/ACT IN AERS: 2.0000 | INHALATION_SPRAY | RESPIRATORY_TRACT | Status: DC | PRN

## 2020-04-03 MED ORDER — POTASSIUM CHLORIDE 10 MEQ/100ML IV SOLN
10.0000 meq | Freq: Once | INTRAVENOUS | Status: AC
  Administered 2020-04-03: 07:00:00 10 meq via INTRAVENOUS
  Filled 2020-04-03: qty 100

## 2020-04-03 MED ORDER — ALBUTEROL SULFATE (2.5 MG/3ML) 0.083% IN NEBU
5.0000 mg | INHALATION_SOLUTION | Freq: Once | RESPIRATORY_TRACT | Status: AC
  Administered 2020-04-03: 04:00:00 5 mg via RESPIRATORY_TRACT
  Filled 2020-04-03: qty 6

## 2020-04-03 MED ORDER — GABAPENTIN 100 MG PO CAPS
100.0000 mg | ORAL_CAPSULE | Freq: Every day | ORAL | Status: DC
  Administered 2020-04-03: 22:00:00 100 mg via ORAL
  Filled 2020-04-03: qty 1

## 2020-04-03 MED ORDER — INSULIN ASPART 100 UNIT/ML ~~LOC~~ SOLN
10.0000 [IU] | Freq: Three times a day (TID) | SUBCUTANEOUS | Status: DC
  Administered 2020-04-04: 10 [IU] via SUBCUTANEOUS

## 2020-04-03 MED ORDER — INSULIN ASPART 100 UNIT/ML ~~LOC~~ SOLN: 7.0000 [IU] | Freq: Three times a day (TID) | SUBCUTANEOUS | Status: DC

## 2020-04-03 MED ORDER — GABAPENTIN 100 MG PO CAPS
100.0000 mg | ORAL_CAPSULE | Freq: Once | ORAL | Status: AC
  Administered 2020-04-03: 04:00:00 100 mg via ORAL
  Filled 2020-04-03: qty 1

## 2020-04-03 MED ORDER — PANTOPRAZOLE SODIUM 40 MG PO TBEC
40.0000 mg | DELAYED_RELEASE_TABLET | Freq: Every day | ORAL | Status: DC
  Administered 2020-04-03 – 2020-04-04 (×2): 40 mg via ORAL
  Filled 2020-04-03 (×2): qty 1

## 2020-04-03 MED ORDER — INSULIN ASPART 100 UNIT/ML ~~LOC~~ SOLN: 0.0000 [IU] | Freq: Every day | SUBCUTANEOUS | Status: DC

## 2020-04-03 MED ORDER — POTASSIUM CHLORIDE 20 MEQ PO PACK
40.0000 meq | PACK | Freq: Four times a day (QID) | ORAL | Status: AC
Start: 2020-04-03 — End: 2020-04-03
  Administered 2020-04-03 (×2): 40 meq via ORAL
  Filled 2020-04-03 (×2): qty 2

## 2020-04-03 MED ORDER — MOMETASONE FURO-FORMOTEROL FUM 200-5 MCG/ACT IN AERO
2.0000 | INHALATION_SPRAY | Freq: Two times a day (BID) | RESPIRATORY_TRACT | Status: DC
  Administered 2020-04-03 – 2020-04-04 (×2): 2 via RESPIRATORY_TRACT
  Filled 2020-04-03 (×2): qty 8.8

## 2020-04-03 MED ORDER — INSULIN ASPART 100 UNIT/ML ~~LOC~~ SOLN
4.0000 [IU] | Freq: Once | SUBCUTANEOUS | Status: AC
  Administered 2020-04-03: 18:00:00 4 [IU] via SUBCUTANEOUS

## 2020-04-03 NOTE — ED Notes (Signed)
Report given to Kelly, RN.

## 2020-04-03 NOTE — ED Notes (Signed)
Placed a bedside toilet at bedside patient is resting with call bell in reach

## 2020-04-03 NOTE — ED Provider Notes (Signed)
[facility] Provider Note   CSN: [id number] Arrival date & time: 04/02/20  1857     History Chief Complaint  Patient presents with  . Cough    J[facility] Frost is a 49 y.o. female.  The history is provided by the patient.  Cough Cough characteristics:  Productive Severity:  Moderate Onset quality:  Gradual Duration:  5 days Timing:  Intermittent Progression:  Worsening Chronicity:  New Smoker: no   Relieved by:  Nothing Worsened by:  Nothing Associated symptoms: chills, shortness of breath and wheezing   Associated symptoms: no fever   Associated symptoms comment:  Chest pain with cough  Patient with history of asthma and diabetes presents with wheezing and shortness of breath.  Patient reports approximately 5 days ago she began having a cough.  Over the past day she did have increasing wheezing and shortness of breath.  She also reports loss of taste and smell.  She is not vaccinated for COVID-19.  No fevers or vomiting.  She has chest pain only with cough.  No hemoptysis.     Past Medical History:  Diagnosis Date  . Anemia   . Asthma   . Diabetes mellitus without complication [facility])     Patient Active Problem List   Diagnosis Date Noted  . DKA (diabetic ketoacidoses) 11/30/2019  . Asthma   . Renal insufficiency   . SIRS (systemic inflammatory response syndrome) (HCC)     Past Surgical History:  Procedure Laterality Date  . CHOLECYSTECTOMY       OB History   No obstetric history on file.     Family History  Problem Relation Age of Onset  . Hypertension Mother   . Asthma Father     Social History   Tobacco Use  . Smoking status: Never Smoker  . Smokeless tobacco: Never Used  Vaping Use  . Vaping Use: Never used  Substance Use Topics  . Alcohol use: Not Currently  . Drug use: Not Currently    Home Medications Prior to Admission medications   Medication Sig Start Date End Date Taking? Authorizing  Provider  BD PEN NEEDLE NANO 2ND GEN 32G X 4 MM MISC SMARTSIG:Injection Daily 11/29/19   [provider]  blood glucose meter kit and supplies Dispense based on patient and insurance preference. Use up to four times daily as directed. (FOR ICD-10 E10.9, E11.9). 12/03/19   Georgette Shell, MD  dexlansoprazole (DEXILANT) 60 MG capsule Take 1 capsule (60 mg total) by mouth daily. 12/08/19 12/07/20  Vevelyn Francois, NP  escitalopram (LEXAPRO) 10 MG tablet Take 1 tablet (10 mg total) by mouth daily. 12/08/19 12/07/20  Vevelyn Francois, NP  gabapentin (NEURONTIN) 100 MG capsule Take 100 mg by mouth daily. 10/02/19   [provider]  insulin aspart (NOVOLOG FLEXPEN) 100 UNIT/ML FlexPen Inject 8 Units into the skin 3 (three) times daily with meals. 12/03/19   Georgette Shell, MD  insulin detemir (LEVEMIR FLEXTOUCH) 100 UNIT/ML FlexPen Inject 15 Units into the skin 2 (two) times daily. 12/03/19   Georgette Shell, MD  Insulin Pen Needle (PEN NEEDLES 3/16") 31G X 5 MM MISC 8 Units by Does not apply route in the morning, at noon, and at bedtime. 12/03/19   Georgette Shell, MD  ipratropium-albuterol (DUONEB) 0.5-2.5 (3) MG/3ML SOLN Take 3 mLs by nebulization 4 (four) times daily as needed (shortness  of breath).  09/03/19   [provider]  losartan (COZAAR) 25 MG  tablet Take 25 mg by mouth daily. 09/03/19   [provider]  MAGNESIUM-OXIDE 400 (241.3 Mg) MG tablet Take 400 mg by mouth daily.  07/31/19   [provider]  medroxyPROGESTERone (PROVERA) 10 MG tablet Take 10 mg by mouth daily. 07/31/19   [provider]  metFORMIN (GLUCOPHAGE-XR) 500 MG 24 hr tablet Take 1 tablet (500 mg total) by mouth 2 (two) times daily. 12/03/19   Georgette Shell, MD  montelukast (SINGULAIR) 10 MG tablet Take 10 mg by mouth daily. 08/18/19   [provider]  pantoprazole (PROTONIX) 40 MG tablet Take 1 tablet (40 mg total) by mouth daily. 12/08/19   Vevelyn Francois, NP  sucralfate (CARAFATE) 1 g tablet Take 1 tablet (1 g total) by mouth 4 (four) times daily -  with meals and at bedtime for 14 days. 12/08/19 12/22/19  Vevelyn Francois, NP  SYMBICORT 160-4.5 MCG/ACT inhaler Inhale 2 puffs into the lungs 2 (two) times daily. 12/03/19   Georgette Shell, MD  TRADJENTA 5 MG TABS tablet Take 1 tablet (5 mg total) by mouth every morning. 12/03/19   Georgette Shell, MD  VENTOLIN HFA 108 [phone] Base) MCG/ACT inhaler Inhale 2 puffs into the lungs as needed for wheezing or shortness of breath. 12/03/19   Georgette Shell, MD    Allergies    Patient has no known allergies.  Review of Systems   Review of Systems  Constitutional: Positive for chills. Negative for fever.  Respiratory: Positive for cough, shortness of breath and wheezing.   Cardiovascular: Negative for leg swelling.  Gastrointestinal: Negative for vomiting.  All other systems reviewed and are negative.   Physical Exam Updated Vital Signs BP (!) 182/104 (BP Location: Left Arm)   Pulse (!) 101   Temp (!) 97.4 F (36.3 C) (Temporal)   Resp (!) 27   Ht 1.626 m ('5\' 4"' )   Wt 75.8 kg   LMP 03/03/2020   SpO2 100%   BMI 28.68 kg/m   Physical Exam CONSTITUTIONAL: Well developed/well nourished HEAD: Normocephalic/atraumatic EYES: EOMI/PERRL ENMT: Mucous membranes moist NECK: supple no meningeal signs SPINE/BACK:entire spine nontender CV: S1/S2 noted, no murmurs/rubs/gallops noted LUNGS: Tachypnea, wheezing bilaterally ABDOMEN: soft, nontender, no rebound or guarding, bowel sounds noted throughout abdomen GU:no cva tenderness NEURO: Pt is awake/alert/appropriate, moves all extremitiesx4.  No facial droop.   EXTREMITIES: pulses normal/equal, full ROM,, no calf tenderness or edema SKIN: warm, color normal PSYCH: no abnormalities of mood noted, alert and oriented to situation  ED Results / Procedures / Treatments   Labs (all labs ordered are listed, but only abnormal results are  displayed) Labs Reviewed  CBC WITH DIFFERENTIAL/PLATELET - Abnormal; Notable for the following components:      Result Value   RBC 3.72 (*)    Hemoglobin 9.7 (*)    HCT 29.9 (*)    RDW 17.1 (*)    All other components within normal limits  COMPREHENSIVE METABOLIC PANEL - Abnormal; Notable for the following components:   Potassium 2.7 (*)    CO2 19 (*)    Glucose, Bld 375 (*)    Calcium 8.5 (*)    Albumin 3.4 (*)    AST 14 (*)    Anion gap 16 (*)    All other components within normal limits  CBG MONITORING, ED - Abnormal; Notable for the following components:   Glucose-Capillary 201 (*)    All other components within normal limits  CBG MONITORING, ED - Abnormal;  Notable for the following components:   Glucose-Capillary 316 (*)    All other components within normal limits  I-STAT CHEM 8, ED - Abnormal; Notable for the following components:   Potassium 2.7 (*)    Glucose, Bld 382 (*)    Calcium, Ion 0.96 (*)    TCO2 20 (*)    Hemoglobin 10.5 (*)    HCT 31.0 (*)    All other components within normal limits  RESP PANEL BY RT-PCR (FLU A&B, COVID) ARPGX2  D-DIMER, QUANTITATIVE (NOT AT [facility])  TROPONIN I (HIGH SENSITIVITY)  TROPONIN I (HIGH SENSITIVITY)    EKG EKG Interpretation  Date/Time:  Sunday April 02 2020 19:48:24 EST Ventricular Rate:  97 PR Interval:  132 QRS Duration: 86 QT Interval:  342 QTC Calculation: 434 R Axis:   80 Text Interpretation: Normal sinus rhythm Nonspecific ST and T wave abnormality Abnormal ECG No significant change since last tracing Confirmed by Ripley Fraise [phone]) on 04/03/2020 2:11:21 AM   Radiology DG Chest 2 View  Result Date: 04/02/2020 CLINICAL DATA:  Pt states she generally has allergies this time of year. Pt had a wet deep cough in x-ray. EXAM: CHEST - 2 VIEW COMPARISON:  Chest radiograph 11/30/2019 FINDINGS: The cardiomediastinal contours are within normal limits. Lungs are hyperinflated which may indicate air trapping. The  lungs are clear. No pneumothorax or pleural effusion. No acute finding in the visualized skeleton. IMPRESSION: No evidence of active disease in the chest. Lungs are hyperinflated which may indicate air trapping. Electronically Signed   By: Audie Pinto M.D.   On: 04/02/2020 20:43   DG Chest Port 1 View  Result Date: 04/03/2020 CLINICAL DATA:  Shortness of breath. Cough for 2-3 days. History of asthma. EXAM: PORTABLE CHEST 1 VIEW COMPARISON:  04/02/2020 FINDINGS: The cardiomediastinal silhouette is unchanged with normal heart size. No airspace consolidation, edema, pleural effusion, pneumothorax is identified. No acute osseous abnormality is seen. IMPRESSION: No active disease. Electronically Signed   By: Logan Bores M.D.   On: 04/03/2020 06:23    Procedures .Critical Care Performed by: Ripley Fraise, MD Authorized by: Ripley Fraise, MD   Critical care provider statement:    Critical care time (minutes):  82   Critical care start time:  04/03/2020 5:58 AM   Critical care end time:  04/03/2020 7:20 AM   Critical care time was exclusive of:  Separately billable procedures and treating other patients   Critical care was necessary to treat or prevent imminent or life-threatening deterioration of the following conditions:  Endocrine crisis, dehydration, respiratory failure, circulatory failure and shock   Critical care was time spent personally by me on the following activities:  Development of treatment plan with patient or surrogate, evaluation of patient's response to treatment, examination of patient, review of old charts, re-evaluation of patient's condition, pulse oximetry, ordering and review of radiographic studies, ordering and review of laboratory studies, ordering and performing treatments and interventions and discussions with consultants   I assumed direction of critical care for this patient from another provider in my specialty: no     Care discussed with: admitting provider         Medications Ordered in ED Medications  potassium chloride 10 mEq in 100 mL IVPB (10 mEq Intravenous New Bag/Given 04/03/20 0718)  albuterol (VENTOLIN HFA) 108 (90 Base) MCG/ACT inhaler 8 puff (8 puffs Inhalation Given 04/03/20 0233)  predniSONE (DELTASONE) tablet 60 mg (60 mg Oral Given 04/03/20 0232)  albuterol (PROVENTIL) (2.5 MG/3ML) 0.083% nebulizer  solution 5 mg (5 mg Nebulization Given 04/03/20 0351)  gabapentin (NEURONTIN) capsule 100 mg (100 mg Oral Given 04/03/20 0415)  benzonatate (TESSALON) capsule 100 mg (100 mg Oral Given 04/03/20 0415)  albuterol (PROVENTIL,VENTOLIN) solution continuous neb (10 mg/hr Nebulization Given 04/03/20 0450)  lactated ringers bolus 1,000 mL (0 mLs Intravenous Stopped 04/03/20 0624)  lactated ringers bolus 1,000 mL (0 mLs Intravenous Stopped 04/03/20 1771)    ED Course  I have reviewed the triage vital signs and the nursing notes.  Pertinent labs & imaging results that were available during my care of the patient were reviewed by me and considered in my medical decision making (see chart for details).    MDM Rules/Calculators/A&P                          2:30 AM Patient reports increasing cough wheezing and shortness of breath.  Patient will need albuterol and steroids here.  Patient also need to be tested for COVID-19 5:56 AM Patient had some improvement with initial nebs, but felt that she needed an hour-long nebulized treatment.   Her work of breathing had improved.  Soon after finishing the hour-long nebulizer, patient became diaphoretic and felt weak. She received home gabapentin and tessalon for cough but was given over an hour prior to hypotension   Her blood pressure dropped to the 70s and she appeared diaphoretic.  Repeat EKG is unchanged.  IV fluids have been started.  Glucose is now over 300.  Will check labs, repeat chest x-ray and admit to the hospital Pt denies CP at this time 7:31 AM Pt now with hypertension and  tachycardia She is now reporting pleuritic CP that radiates to back She is not hypoxic Repeat CXR negative Due to abrupt vital sign abnormality, this could represent PE D-dimer pending Endorsed to internal medicine teaching service who will admit patient She may need CT chest prior to admission Pt is hyperglycemic and hypokalemic However no signs of DKA   ED ECG REPORT   Date: 04/03/2020 0550  Rate: 64  Rhythm: normal sinus rhythm  QRS Axis: normal  Intervals: normal  ST/T Wave abnormalities: nonspecific ST changes  Conduction Disutrbances:none  Narrative Interpretation:   Old EKG Reviewed: unchanged  I have personally reviewed the EKG tracing and agree with the computerized printout as noted.    Lylliana Kitamura was evaluated in Emergency Department on 04/03/2020 for the symptoms described in the history of present illness. She was evaluated in the context of the global COVID-19 pandemic, which necessitated consideration that the patient might be at risk for infection with the SARS-CoV-2 virus that causes COVID-19. Institutional protocols and algorithms that pertain to the evaluation of patients at risk for COVID-19 are in a state of rapid change based on information released by regulatory bodies including the CDC and federal and state organizations. These policies and algorithms were followed during the patient's care in the ED.  Final Clinical Impression(s) / ED Diagnoses Final diagnoses:  Severe persistent asthma with status asthmaticus  Hypokalemia    Rx / DC Orders ED Discharge Orders    None       Ripley Fraise, MD 04/03/20 [phone]

## 2020-04-03 NOTE — ED Notes (Signed)
While receiving a continuous neb tx pt became hypotensive and diaphoretic pt still alert and oriented x 4. 2 IVs started. Dr. Bebe Shaggy made aware and at bedside. Orders for fluids received at this time.

## 2020-04-03 NOTE — H&P (Signed)
Date: 04/03/2020               Patient Name:  [facility] Frost MRN: [id number]  DOB: 02-11-1971 Age / Sex: 49 y.o., female   PCP: Vevelyn Francois, NP         Medical Service: Internal Medicine Teaching Service         Attending Physician: Dr. Lucious Groves, DO    First Contact: Dr. Lisabeth Devoid Pager: [phone]  Second Contact: Dr. Marianna Payment Pager: [phone]       After Hours (After 5p/  First Contact Pager: [phone]  weekends / holidays): Second Contact Pager: [phone]   Chief Complaint: shortness of breath   History of Present Illness: J[facility] Frost is a 49 y/o female with history of asthma and insulin-dependent type II DM who presents with progressive shortness of breath since yesterday. 6 days ago, her symptoms began with a non-productive cough. She also developed nasal congestion and scratchy throat. She has been taking Robitussin without much relief. She started developing shortness of breath similar to previous asthma flares yesterday. She tried a nebulizer treatment at home without relief which prompted her to come to the ED.  Upon arrival, she received a continuous neb for about an hour which gave her some relief. She subsequently became diaphoretic, nauesated and hypotensive. She recalls feeling like she was going to pass out. Also endorsed pleuritic chest pain that radiated to her back.  She has not noticed any asymmetrical calf pain or swelling. No history of blood clots.  Furthermore, she denies fevers, chills, abdominal pain, hemoptysis, vomiting, diarrhea, or urinary symptoms. No sick contacts at home. She is not vaccinated for COVID-19.   She has been hospitalized several times for asthma exacerbations, most recently a couple years ago. She required intubation when she was 36.   Meds:  Current Meds  Medication Sig  . BD PEN NEEDLE NANO 2ND GEN 32G X 4 MM MISC SMARTSIG:Injection Daily  . blood glucose meter kit and supplies Dispense based on patient and insurance preference. Use up  to four times daily as directed. (FOR ICD-10 E10.9, E11.9).  Marland Kitchen dexlansoprazole (DEXILANT) 60 MG capsule Take 1 capsule (60 mg total) by mouth daily.  Marland Kitchen escitalopram (LEXAPRO) 10 MG tablet Take 1 tablet (10 mg total) by mouth daily.  Marland Kitchen gabapentin (NEURONTIN) 100 MG capsule Take 100 mg by mouth at bedtime.  . insulin aspart (NOVOLOG FLEXPEN) 100 UNIT/ML FlexPen Inject 8 Units into the skin 3 (three) times daily with meals.  . insulin detemir (LEVEMIR FLEXTOUCH) 100 UNIT/ML FlexPen Inject 15 Units into the skin 2 (two) times daily.  . Insulin Pen Needle (PEN NEEDLES 3/16") 31G X 5 MM MISC 8 Units by Does not apply route in the morning, at noon, and at bedtime.  Marland Kitchen ipratropium-albuterol (DUONEB) 0.5-2.5 (3) MG/3ML SOLN Take 3 mLs by nebulization 4 (four) times daily as needed (shortness  of breath).   . losartan (COZAAR) 25 MG tablet Take 25 mg by mouth daily.  Marland Kitchen MAGNESIUM-OXIDE 400 (241.3 Mg) MG tablet Take 400 mg by mouth daily.   . medroxyPROGESTERone (PROVERA) 10 MG tablet Take 10 mg by mouth daily.  . metFORMIN (GLUCOPHAGE-XR) 500 MG 24 hr tablet Take 1 tablet (500 mg total) by mouth 2 (two) times daily.  . montelukast (SINGULAIR) 10 MG tablet Take 10 mg by mouth daily.  . pantoprazole (PROTONIX) 40 MG tablet Take 1 tablet (40 mg total) by mouth daily.  . SYMBICORT 160-4.5 MCG/ACT inhaler Inhale  2 puffs into the lungs 2 (two) times daily.  . TRADJENTA 5 MG TABS tablet Take 1 tablet (5 mg total) by mouth every morning.  . VENTOLIN HFA 108 (90 Base) MCG/ACT inhaler Inhale 2 puffs into the lungs as needed for wheezing or shortness of breath.  . zolpidem (AMBIEN) 5 MG tablet Take 5 mg by mouth at bedtime as needed.     Allergies: Allergies as of 04/02/2020  . (No Known Allergies)   Past Medical History:  Diagnosis Date  . Anemia   . Asthma   . Diabetes mellitus without complication ([place])     Family History:  Family History  Problem Relation Age of Onset  . Hypertension Mother   .  Asthma Father    Also positive for DM and CVA   Social History: she lives with her daughter and two grandsons. She has not smoked since her 81s. Very occasional alcohol use. No illicit drug use.   Review of Systems: A complete ROS was negative except as per HPI.   Physical Exam: Blood pressure (!) 148/74, pulse (!) 109, temperature (!) 97 F (36.1 C), temperature source Oral, resp. rate 16, height _0  (1.626 m), weight 75.8 kg, last menstrual period 03/03/2020, SpO2 100 %. General: awake, alert, pleasant female, lying in bed in NAD HEENT: [place]/AT; sclera clear. Dry mucous membranes. Posterior oropharynx mildly erythematous Neck: no tender lymph nodes CV: tachycardic, regular rhythm Pulm: patient is dyspneic with conversation but can speak in full sentences. No accessory muscle use. She is oxygenating well on room air. Lungs with diffuse expiratory wheezes and decreased air movement Abd: BS+; abdomen soft, non-tender, non-distended Ext: Warm and well perfused; no edema. No calf tenderness or asymmetric swelling Neuro: A&Ox4; no focal deficits    EKG: personally reviewed my interpretation is NSR; no acute ischemic changes   CXR: personally reviewed my interpretation is no PTX, focal infiltrates or effusions   Assessment & Plan by Problem: Active Problems:   Asthma exacerbation  Ms. Windhorst is a 49 y/o female with history of asthma and insulin-dependent type II DM who presents with progressive shortness of breath since yesterday. While undergoing treatment in the ED, she had a near-syncopal event in which she became diaphoretic and hypotensive. Vitals normalized after IVF. Her WELLS score was low, so d-dimer was checked which came back elevated. CTA was subsequently ordered to rule out PE. She was admitted for further management.   Acute asthma exacerbation -she is currently HDS and oxygenating well on room air  -no evidence of pneumonia on cxr; likely from viral URI.  -f/u  RVP -start systemic steroids -scheduled duonebs q 4 hours -continue home inhaler regimen and singulair  Near-syncope -while in the ED, patient developed nausea, pleuritic chest pain and became acutely diaphoretic and hypotensive -blood pressure improved after fluid bolus -she was considered low-risk for PE, so screened with d-dimer which came back elevated so CT angio was ordered to rule out PE  -f/u CT scan  -no other obvious cause for events at this time -continue telemetry monitoring   Insulin-dependent type II DM -patient is on levemir 15 units BID, novolog 8 units TID, and metformin as an outpatient -she hasn't been eating that well since symptom onset so will reduce regimen to levemir 10 units BID, and 4 units TID with meals, as well as SSI  DVT ppx: lovenox Diet: CM CODE: FULL   Dispo: Admit patient to Observation with expected length of stay less than 2 midnights.  SignedDelice Bison, DO 04/03/2020, 9:35 AM  Pager: [phone] After 5pm on weekdays and 1pm on weekends: On Call pager: [phone]

## 2020-04-03 NOTE — ED Notes (Signed)
First bolus completed, pt remains hypotensive. Dr. Bebe Shaggy made aware, received orders for a second bolus of LR

## 2020-04-04 ENCOUNTER — Other Ambulatory Visit (HOSPITAL_COMMUNITY): Payer: Self-pay | Admitting: Student

## 2020-04-04 DIAGNOSIS — E876 Hypokalemia: Secondary | ICD-10-CM | POA: Diagnosis not present

## 2020-04-04 DIAGNOSIS — J4552 Severe persistent asthma with status asthmaticus: Secondary | ICD-10-CM | POA: Diagnosis not present

## 2020-04-04 DIAGNOSIS — R55 Syncope and collapse: Secondary | ICD-10-CM | POA: Diagnosis not present

## 2020-04-04 DIAGNOSIS — J45901 Unspecified asthma with (acute) exacerbation: Secondary | ICD-10-CM | POA: Diagnosis not present

## 2020-04-04 LAB — MAGNESIUM: Magnesium: 1.5 mg/dL — ABNORMAL LOW (ref 1.7–2.4)

## 2020-04-04 LAB — BASIC METABOLIC PANEL
Anion gap: 11 (ref 5–15)
BUN: 14 mg/dL (ref 6–20)
CO2: 21 mmol/L — ABNORMAL LOW (ref 22–32)
Calcium: 8.9 mg/dL (ref 8.9–10.3)
Chloride: 103 mmol/L (ref 98–111)
Creatinine, Ser: 0.82 mg/dL (ref 0.44–1.00)
GFR, Estimated: >60 mL/min (ref >60)
Glucose, Bld: 327 mg/dL — ABNORMAL HIGH (ref 70–99)
Potassium: 3.7 mmol/L (ref 3.5–5.1)
Sodium: 135 mmol/L (ref 135–145)

## 2020-04-04 LAB — CBC
HCT: 27.1 % — ABNORMAL LOW (ref 36.0–46.0)
Hemoglobin: 9.3 g/dL — ABNORMAL LOW (ref 12.0–15.0)
MCH: 27 pg (ref 26.0–34.0)
MCHC: 34.3 g/dL (ref 30.0–36.0)
MCV: 78.8 fL — ABNORMAL LOW (ref 80.0–100.0)
Platelets: 442 10*3/uL — ABNORMAL HIGH (ref 150–400)
RBC: 3.44 MIL/uL — ABNORMAL LOW (ref 3.87–5.11)
RDW: 17.3 % — ABNORMAL HIGH (ref 11.5–15.5)
WBC: 9.1 10*3/uL (ref 4.0–10.5)
nRBC: 0 % (ref 0.0–0.2)

## 2020-04-04 LAB — GLUCOSE, CAPILLARY
Glucose-Capillary: 153 mg/dL — ABNORMAL HIGH (ref 70–99)
Glucose-Capillary: 32 mg/dL — CL (ref 70–99)
Glucose-Capillary: 91 mg/dL (ref 70–99)

## 2020-04-04 MED ORDER — GUAIFENESIN-CODEINE 100-10 MG/5ML PO SOLN: 5.0000 mL | Freq: Three times a day (TID) | ORAL | 0 refills | Status: AC | PRN

## 2020-04-04 MED ORDER — LOSARTAN POTASSIUM 25 MG PO TABS
25.0000 mg | ORAL_TABLET | Freq: Every day | ORAL | Status: DC
  Administered 2020-04-04: 25 mg via ORAL
  Filled 2020-04-04: qty 1

## 2020-04-04 MED ORDER — BENZONATATE 100 MG PO CAPS: 100.0000 mg | ORAL_CAPSULE | Freq: Three times a day (TID) | ORAL | 0 refills | Status: DC

## 2020-04-04 MED ORDER — PREDNISONE 20 MG PO TABS: ORAL_TABLET | ORAL | 0 refills | Status: DC

## 2020-04-04 MED ORDER — MAGNESIUM SULFATE 2 GM/50ML IV SOLN
2.0000 g | Freq: Once | INTRAVENOUS | Status: AC
  Administered 2020-04-04: 2 g via INTRAVENOUS
  Filled 2020-04-04: qty 50

## 2020-04-04 MED FILL — BENZONATATE 100 MG CAP: 100 | 7 days supply | Qty: 21 | Fill #0

## 2020-04-04 MED FILL — predniSONE 20 MG TABS: 20 | 9 days supply | Qty: 11 | Fill #0

## 2020-04-04 NOTE — Progress Notes (Signed)
O2 Sats remain 100% while ambulating. Denies SOB.

## 2020-04-04 NOTE — Progress Notes (Signed)
HD#0 Subjective:  Overnight Events: None  Reports feeling much better this morning no long need O2. Continues to have a cough but otherwise denies fevers and SOB, no further feeling of dizziness, diaphoresis or syncope. Agreeable with going home and treating URI symptoms symptomatically along with prednisone taper.   Objective:  Vital signs in last 24 hours: Vitals:   04/04/20 0143 04/04/20 0214 04/04/20 0455 04/04/20 0504  BP: (!) 159/90 (!) 166/98  (!) 162/94  Pulse: (!) 105 97  (!) 104  Resp: (!) 24 20  20   Temp: 98.3 F (36.8 C) 98.4 F (36.9 C)  98 F (36.7 C)  TempSrc: Oral Oral  Axillary  SpO2: 100% 100% 99% 100%  Weight:  77.3 kg    Height:       Supplemental O2: Room Air SpO2: 100 % O2 Flow Rate (L/min): 0 L/min FiO2 (%): 21 %   Physical Exam:  Physical Exam Constitutional:      Appearance: Normal appearance.  HENT:     Head: Normocephalic and atraumatic.     Mouth/Throat:     Mouth: Mucous membranes are moist.     Comments: Coughing intermittently Cardiovascular:     Rate and Rhythm: Normal rate and regular rhythm.     Pulses: Normal pulses.     Heart sounds: Normal heart sounds.  Pulmonary:     Effort: Pulmonary effort is normal.     Breath sounds: Normal breath sounds. No wheezing.  Abdominal:     General: Abdomen is flat. Bowel sounds are normal.     Palpations: Abdomen is soft.  Skin:    General: Skin is warm and dry.     Capillary Refill: Capillary refill takes less than 2 seconds.  Neurological:     General: No focal deficit present.     Mental Status: She is alert. Mental status is at baseline.     Sensory: No sensory deficit.     Filed Weights   04/02/20 2002 04/04/20 0214  Weight: 75.8 kg 77.3 kg    No intake or output data in the 24 hours ending 04/04/20 0556 Net IO Since Admission: No IO data has been entered for this period [04/04/20 0556]  Pertinent Labs: CBC Latest Ref Rng & Units 04/04/2020 04/03/2020 04/03/2020  WBC  4.0 - 10.5 K/uL 9.1 - 7.0  Hemoglobin 12.0 - 15.0 g/dL 04/05/2020) 10.5(L) 9.7(L)  Hematocrit 36.0 - 46.0 % 27.1(L) 31.0(L) 29.9(L)  Platelets 150 - 400 K/uL 442(H) - 385    CMP Latest Ref Rng & Units 04/04/2020 04/03/2020 04/03/2020  Glucose 70 - 99 mg/dL 04/05/2020) 888(K) -  BUN 6 - 20 mg/dL 14 14 -  Creatinine 800(L - 1.00 mg/dL 4.91 7.91 5.05)  Sodium 135 - 145 mmol/L 135 134(L) -  Potassium 3.5 - 5.1 mmol/L 3.7 3.4(L) -  Chloride 98 - 111 mmol/L 103 104 -  CO2 22 - 32 mmol/L 21(L) 20(L) -  Calcium 8.9 - 10.3 mg/dL 8.9 9.1 -  Total Protein 6.5 - 8.1 g/dL - - -  Total Bilirubin 0.3 - 1.2 mg/dL - - -  Alkaline Phos 38 - 126 U/L - - -  AST 15 - 41 U/L - - -  ALT 0 - 44 U/L - - -   Imaging: CT ANGIO CHEST PE W OR WO CONTRAST  Result Date: 04/03/2020 CLINICAL DATA:  Shortness of breath and wheezing.  Positive D-dimer EXAM: CT ANGIOGRAPHY CHEST WITH CONTRAST TECHNIQUE: Multidetector CT imaging of the chest  was performed using the standard protocol during bolus administration of intravenous contrast. Multiplanar CT image reconstructions and MIPs were obtained to evaluate the vascular anatomy. CONTRAST:  60 mL Omnipaque 350 nonionic COMPARISON:  Chest radiograph April 03, 2020 FINDINGS: Cardiovascular: There is no demonstrable pulmonary embolus. There is no thoracic aortic aneurysm or dissection. Visualized great vessels appear unremarkable. Note that the right innominate and left common carotid arteries arise as a common trunk, an anatomic variant. No pericardial effusion or pericardial thickening evident. There are scattered foci of coronary artery calcification. Mediastinum/Nodes: No thyroid lesions are evident. There is focal adenopathy at the aortopulmonary window measuring 2.1 x 1.5 cm. There is a subcarinal lymph node measuring 1.1 x 1.1 cm. There is a precarinal lymph node measuring 1.3 x 1.2 cm. There is a lymph node to the left of the distal trachea measuring 1.4 x 1.3 cm. No esophageal  lesions are evident. Lungs/Pleura: There is a nodular opacity in the anterior segment of the left upper lobe measuring 1.3 x 1.1 cm, best appreciated on axial slice 59 series 7. There is mild bibasilar atelectasis. There is no edema or consolidation. No pleural effusions are appreciable. There is slight upper and lower lobe bronchiectatic change bilaterally. Upper Abdomen: Visualized upper abdominal structures appear unremarkable. Musculoskeletal: There are no blastic or lytic bone lesions. No evident chest wall lesions. Review of the MIP images confirms the above findings. IMPRESSION: 1. No demonstrable pulmonary embolus. No thoracic aortic aneurysm or dissection. There are foci of coronary artery calcification. 2. There is a nodular opacity in the anterior segment left upper lobe measuring 1.3 x 1.1 cm. No edema or airspace opacity. 3. Foci of adenopathy, most notably at the aortopulmonary window region. Concern for underlying neoplasm. It may be prudent to correlate with nuclear medicine PET study to further evaluate. Electronically Signed   By: Bretta Bang III M.D.   On: 04/03/2020 09:53   DG Chest Port 1 View  Result Date: 04/03/2020 CLINICAL DATA:  Shortness of breath. Cough for 2-3 days. History of asthma. EXAM: PORTABLE CHEST 1 VIEW COMPARISON:  04/02/2020 FINDINGS: The cardiomediastinal silhouette is unchanged with normal heart size. No airspace consolidation, edema, pleural effusion, pneumothorax is identified. No acute osseous abnormality is seen. IMPRESSION: No active disease. Electronically Signed   By: Sebastian Ache M.D.   On: 04/03/2020 06:23    Assessment/Plan:   Active Problems:   Asthma exacerbation  Patient Summary: Jenna Frost is a 49 y/o female with history of asthma and insulin-dependent type II DM who presents with progressive shortness of breath since yesterday. While undergoing treatment in the ED, she had a near-syncopal event in which she became diaphoretic and  hypotensive. Vitals normalized after IVF. Her WELLS score was low, so d-dimer was checked which came back elevated. CTA was subsequently ordered to rule out PE. She was admitted for further management.   Acute asthma exacerbation This morning this patient continues to be doing well on room air. Still has a cough likely due to viral URI, RVP pending. -Will discharge on prednisone taper - Conservative management with teselon Perles and guaifenesin at home  Near-syncope Doing well this morning, no further events likely vasovagal. BP no hypertensive this morning. Will restart on home losartan  Insulin-dependent type II DM -patient is on levemir 15 units BID, novolog 8 units TID, and metformin as an outpatient -Elevated CBG likely due in the setting of systemic glucocorticoids, increase levemir to 12 units BID, novolog to 10 unit TID  DVT ppx: lovenox Diet: CM CODE: FULL   Dispo: Anticipated discharge to Home today.  Quincy Simmonds, MD 04/04/2020, 5:56 AM Pager: [phone]  Please contact the on call pager after 5 pm and on weekends at [phone].

## 2020-04-04 NOTE — Plan of Care (Signed)
  Problem: Education: Goal: Knowledge of General Education information will improve Description: Including pain rating scale, medication(s)/side effects and non-pharmacologic comfort measures Outcome: Progressing   Problem: Health Behavior/Discharge Planning: Goal: Ability to manage health-related needs will improve Outcome: Progressing   Problem: Clinical Measurements: Goal: Ability to maintain clinical measurements within normal limits will improve Outcome: Progressing Goal: Will remain free from infection Outcome: Progressing Goal: Diagnostic test results will improve Outcome: Progressing Goal: Respiratory complications will improve Outcome: Progressing Goal: Cardiovascular complication will be avoided Outcome: Progressing   Problem: Coping: Goal: Level of anxiety will decrease Outcome: Progressing   Problem: Elimination: Goal: Will not experience complications related to bowel motility Outcome: Progressing Goal: Will not experience complications related to urinary retention Outcome: Progressing   Problem: Skin Integrity: Goal: Risk for impaired skin integrity will decrease Outcome: Progressing   

## 2020-04-05 NOTE — Discharge Summary (Signed)
Name: Jenna Frost MRN: [id number] DOB: 07-Jun-1970 49 y.o. PCP: Vevelyn Francois, NP  Date of Admission: 04/02/2020  7:48 PM Date of Discharge: 04/04/2020 Attending Physician: No att. providers found  Discharge Diagnosis: 1. Asthma excerbation  Discharge Medications: Allergies as of 04/04/2020   No Known Allergies     Medication List    STOP taking these medications   pantoprazole 40 MG tablet Commonly known as: PROTONIX     TAKE these medications   benzonatate 100 MG capsule Commonly known as: Tessalon Perles Take 1 capsule (100 mg total) by mouth 3 (three) times daily for 7 days.   blood glucose meter kit and supplies Dispense based on patient and insurance preference. Use up to four times daily as directed. (FOR ICD-10 E10.9, E11.9).   Dexilant 60 MG capsule Generic drug: dexlansoprazole Take 1 capsule (60 mg total) by mouth daily.   escitalopram 10 MG tablet Commonly known as: LEXAPRO Take 1 tablet (10 mg total) by mouth daily.   gabapentin 100 MG capsule Commonly known as: NEURONTIN Take 100 mg by mouth at bedtime.   guaiFENesin-codeine 100-10 MG/5ML syrup Take 5 mLs by mouth 3 (three) times daily as needed for up to 7 days for cough.   ipratropium-albuterol 0.5-2.5 (3) MG/3ML Soln Commonly known as: DUONEB Take 3 mLs by nebulization 4 (four) times daily as needed (shortness  of breath).   Levemir FlexTouch 100 UNIT/ML FlexPen Generic drug: insulin detemir Inject 15 Units into the skin 2 (two) times daily.   losartan 25 MG tablet Commonly known as: COZAAR Take 25 mg by mouth daily.   MAGnesium-Oxide 400 (241.3 Mg) MG tablet Generic drug: magnesium oxide Take 400 mg by mouth daily.   medroxyPROGESTERone 10 MG tablet Commonly known as: PROVERA Take 10 mg by mouth daily.   metFORMIN 500 MG 24 hr tablet Commonly known as: GLUCOPHAGE-XR Take 1 tablet (500 mg total) by mouth 2 (two) times daily.   montelukast 10 MG tablet Commonly known as:  SINGULAIR Take 10 mg by mouth daily.   NovoLOG FlexPen 100 UNIT/ML FlexPen Generic drug: insulin aspart Inject 8 Units into the skin 3 (three) times daily with meals.   BD Pen Needle Nano 2nd Gen 32G X 4 MM Misc Generic drug: Insulin Pen Needle SMARTSIG:Injection Daily   Pen Needles 3/16" 31G X 5 MM Misc 8 Units by Does not apply route in the morning, at noon, and at bedtime.   predniSONE 20 MG tablet Commonly known as: DELTASONE Take 2 tablets (40 mg total) by mouth daily with breakfast for 3 days, THEN 1 tablet (20 mg total) daily with breakfast for 3 days, THEN 0.5 tablets (10 mg total) daily with breakfast for 4 days. Start taking on: April 05, 2020   Symbicort 160-4.5 MCG/ACT inhaler Generic drug: budesonide-formoterol Inhale 2 puffs into the lungs 2 (two) times daily.   Tradjenta 5 MG Tabs tablet Generic drug: linagliptin Take 1 tablet (5 mg total) by mouth every morning.   Ventolin HFA 108 (90 Base) MCG/ACT inhaler Generic drug: albuterol Inhale 2 puffs into the lungs as needed for wheezing or shortness of breath.   zolpidem 5 MG tablet Commonly known as: AMBIEN Take 5 mg by mouth at bedtime as needed.       Disposition and follow-up:   Jenna Frost was discharged from [facility] in Stable condition.  At the hospital follow up visit please address:  1.  PCP - follow up for improvement of respiratory symptoms  and completion of prednisone taper - Patient with microcytic anemia and slight thrombocythemia may be related to iron deficiency, consider iron studies  - follow up incidental pulmonary nodular opacity of the left upper lobe and foci of adenopathy concerning for possible neoplasm, may need follow up nuclear medicine PET study for further evaluation   2.  Labs / imaging needed at time of follow-up: iron panel, ferritin  3.  Pending labs/ test needing follow-up: none  Follow-up Appointments:  Follow-up Information    Vevelyn Francois, NP. Schedule an appointment as soon as possible for a visit in 2 week(s).   Specialty: Adult Health Nurse Practitioner Contact information: [address] [place] [address] [place] [facility] Course by problem list:  Acute asthma exacerbation Jenna Frost is a 49 y/o female with history of asthma and insulin-dependent type II DM who presents with progressive shortness of breath with associated cough that has worsened with changing weather. COVID-19 testing negative in ED. Treated with albuterol nebulizer and prednisone in ED with improvement in respiratory symptoms and tachycardia on initial evaluation with mild expiratory wheezing on ascultation. Continued to feel well on room air with resolution of wheezing. Discharge on home asthma medication and 10 day prednisone taper.  Near-syncope While undergoing treatment in the ED, she had a near-syncopal event in which she became diaphoretic and hypotensive. She was treated with IV fluids with normalization of vitals. Concern for PE with elevated d-dimer. Subsequently had CTA which was negative for pulmonary embolism. Noted to have incidental ulmonary nodular opacity of the left upper lobe and foci of adenopathy concerning for possible neoplasm will likely need repeat imaging/nuclear medicine PET scan as outpatient. No further episodes during admission suspect this was likely vasovagal.  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 Pt had a wet deep cough in x-ray. EXAM: CHEST - 2 VIEW COMPARISON:  Chest radiograph 11/30/2019 FINDINGS: The cardiomediastinal contours are  within normal limits. Lungs are hyperinflated which may indicate air trapping. The lungs are clear. No pneumothorax or pleural effusion. No acute finding in the visualized skeleton. IMPRESSION: No evidence of active disease in the chest. Lungs are hyperinflated which may indicate air trapping. Electronically Signed   By: Audie Pinto M.D.   On: 04/02/2020 20:43   CT ANGIO CHEST PE W OR WO CONTRAST  Result Date: 04/03/2020 CLINICAL DATA:  Shortness of breath and wheezing.  Positive D-dimer EXAM: CT ANGIOGRAPHY CHEST WITH CONTRAST TECHNIQUE: Multidetector CT imaging of the chest was performed using the standard protocol during bolus administration of intravenous contrast. Multiplanar CT image reconstructions and MIPs were obtained to evaluate the vascular anatomy. CONTRAST:  60 mL Omnipaque 350 nonionic COMPARISON:  Chest radiograph April 03, 2020 FINDINGS: Cardiovascular: There is no demonstrable pulmonary embolus. There is no thoracic aortic aneurysm or dissection. Visualized great vessels appear unremarkable. Note that the right innominate and left common carotid arteries arise as a common trunk, an anatomic variant. No pericardial effusion or  pericardial thickening evident. There are scattered foci of coronary artery calcification. Mediastinum/Nodes: No thyroid lesions are evident. There is focal adenopathy at the aortopulmonary window measuring 2.1 x 1.5 cm. There is a subcarinal lymph node measuring 1.1 x 1.1 cm. There is a precarinal lymph node measuring 1.3 x 1.2 cm. There is a lymph node to the left of the distal trachea measuring 1.4 x 1.3 cm. No esophageal lesions are evident. Lungs/Pleura: There is a nodular opacity in the anterior segment of the left upper lobe measuring 1.3 x 1.1 cm, best appreciated on axial slice 59 series 7. There is mild bibasilar atelectasis. There is no edema or consolidation. No pleural effusions are appreciable. There is slight upper and lower lobe bronchiectatic  change bilaterally. Upper Abdomen: Visualized upper abdominal structures appear unremarkable. Musculoskeletal: There are no blastic or lytic bone lesions. No evident chest wall lesions. Review of the MIP images confirms the above findings. IMPRESSION: 1. No demonstrable pulmonary embolus. No thoracic aortic aneurysm or dissection. There are foci of coronary artery calcification. 2. There is a nodular opacity in the anterior segment left upper lobe measuring 1.3 x 1.1 cm. No edema or airspace opacity. 3. Foci of adenopathy, most notably at the aortopulmonary window region. Concern for underlying neoplasm. It may be prudent to correlate with nuclear medicine PET study to further evaluate. Electronically Signed   By: Lowella Grip III M.D.   On: 04/03/2020 09:53   DG Chest Port 1 View  Result Date: 04/03/2020 CLINICAL DATA:  Shortness of breath. Cough for 2-3 days. History of asthma. EXAM: PORTABLE CHEST 1 VIEW COMPARISON:  04/02/2020 FINDINGS: The cardiomediastinal silhouette is unchanged with normal heart size. No airspace consolidation, edema, pleural effusion, pneumothorax is identified. No acute osseous abnormality is seen. IMPRESSION: No active disease. Electronically Signed   By: Logan Bores M.D.   On: 04/03/2020 06:23   Discharge Instructions: Discharge Instructions    Diet - low sodium heart healthy   Complete by: As directed    Discharge instructions   Complete by: As directed    You were hospitalized for Asthma. Thank you for allowing [place] to be part of your care.    Please follow up with the following providers: Please follow up with you primary care doctor   Please note these changes made to your medications:   - Medications to continue: Continue taking you normal home medications  - Medications to start: Please start taking prednisone 40 mg daily for 3 days then decrease to 20 mg daily for 3 days and then 10 mg daily for 4 days before stopping   Please call our clinic if you  have any questions or concerns, we may be able to help and keep you from a long and expensive emergency room wait. Our clinic and after hours phone number is [phone], the best time to call is Monday through Friday 9 am to 4 pm but there is always someone available 24/7 if you have an emergency. If you need medication refills please notify your pharmacy one week in advance and they will send [place] a request.   Increase activity slowly   Complete by: As directed       Signed: Iona Beard, MD 04/05/2020, 7:47 AM   Pager: [phone]

## 2020-06-30 ENCOUNTER — Other Ambulatory Visit: Payer: Self-pay | Admitting: Nurse Practitioner

## 2020-06-30 ENCOUNTER — Telehealth: Payer: Self-pay

## 2020-06-30 DIAGNOSIS — K219 Gastro-esophageal reflux disease without esophagitis: Secondary | ICD-10-CM

## 2020-06-30 NOTE — Telephone Encounter (Signed)
Insurance info [employer] BIN [id number], PCN [id number], ID [id number]

## 2020-06-30 NOTE — Telephone Encounter (Signed)
She has failed Pantoprazole along with the others I stated this on one note in August.  Do you think this will help? Can we call the patient and make her aware and have her decide if she would like to go back on the pantoprazole.

## 2020-06-30 NOTE — Telephone Encounter (Signed)
Patients insurance does not cover Dexlansoprazole.  Alternatives include: pantoprazole, omeprazole, lansoprazole.  Please advise.

## 2020-06-30 NOTE — Telephone Encounter (Signed)
Key: [id number] - PA Case ID: [id number] Need help? Call us at [phone] Status Sent to [employer] Dexlansoprazole 60MG  dr capsules Form [employer] Electronic PA Form [phone] NCPDP)  Your information has been submitted to [employer]. To check for an updated outcome later, reopen this PA request from your dashboard.  If [employer] has not responded to your request within 24 hours, contact [employer] at [phone]. If you think there may be a problem with your PA request, use our live chat feature at the bottom right.

## 2020-07-13 ENCOUNTER — Other Ambulatory Visit: Payer: Self-pay | Admitting: Nurse Practitioner

## 2021-03-21 ENCOUNTER — Other Ambulatory Visit (HOSPITAL_COMMUNITY): Payer: Self-pay

## 2021-08-27 IMAGING — CT CT ANGIO CHEST
2 of 7 series · 17 of 46 positions shown · IV contrast (APPLIED)
Comparison: Chest radiograph April 03, 2020

CLINICAL DATA: Shortness of breath and wheezing.  Positive D-dimer

EXAM:
CT ANGIOGRAPHY CHEST WITH CONTRAST
TECHNIQUE: Multidetector CT imaging of the chest was performed using the
standard protocol during bolus administration of intravenous
contrast. Multiplanar CT image reconstructions and MIPs were
obtained to evaluate the vascular anatomy.
CONTRAST:  60 mL Omnipaque 350 nonionic

[Series 8: thins · axial · 0.69mm/px · z∈[-24,+225]mm · 14 of 402 slices shown]
[im 23/402  lung]
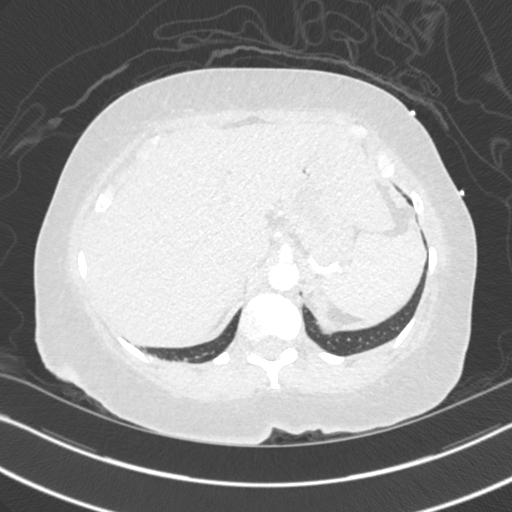
[im 45/402  soft-tissue]
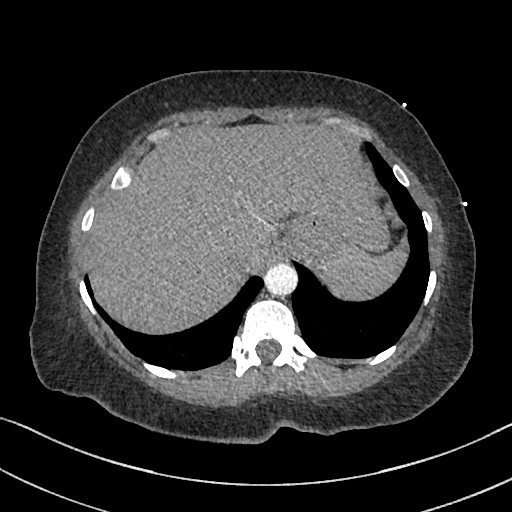
[im 90/402  lung]
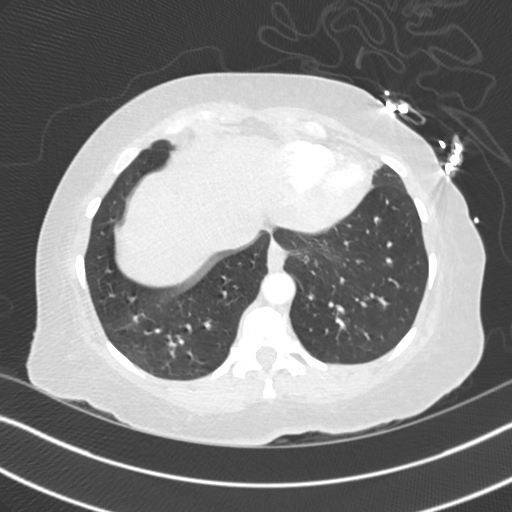
[im 112/402  soft-tissue]
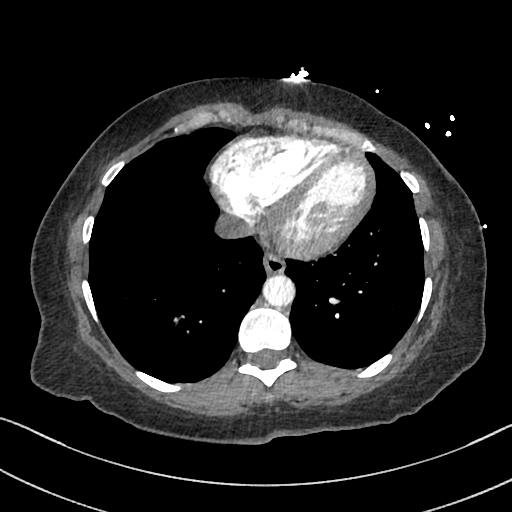
[im 134/402  lung]
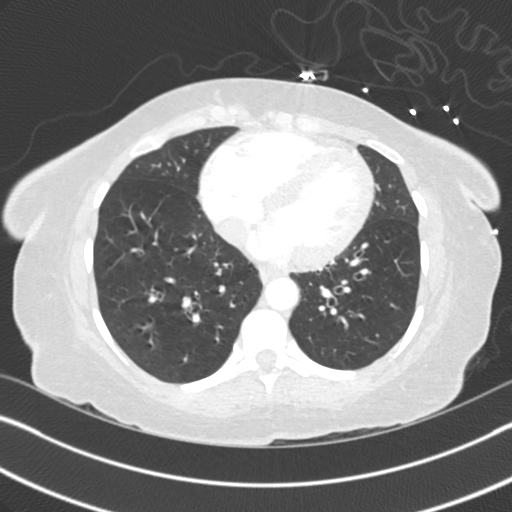
[im 156/402  soft-tissue]
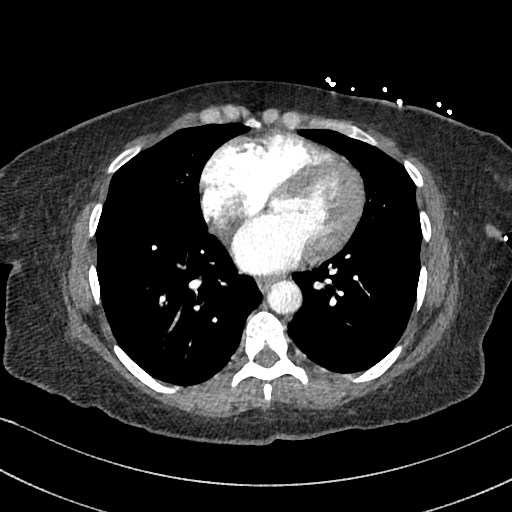
[im 179/402  lung]
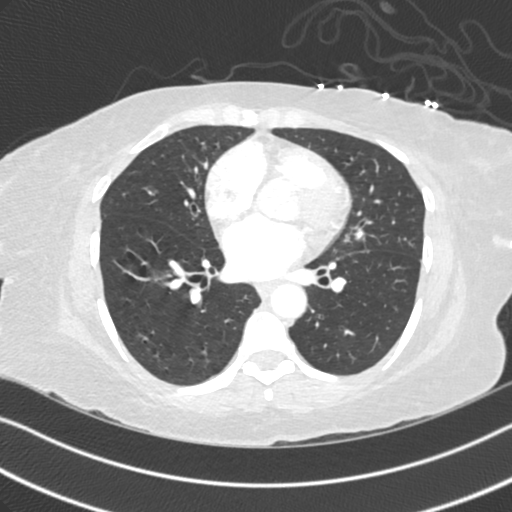
[im 223/402  soft-tissue]
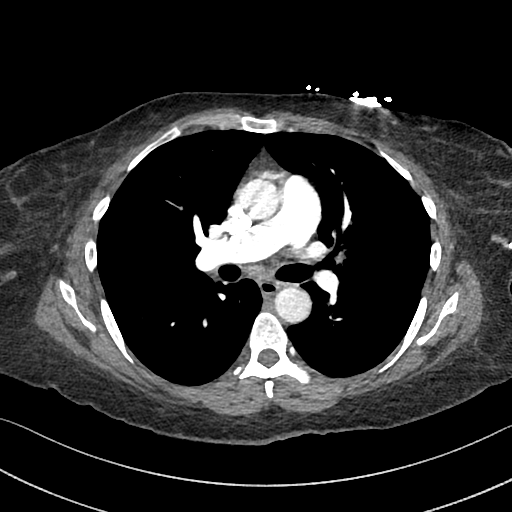
[im 246/402  lung]
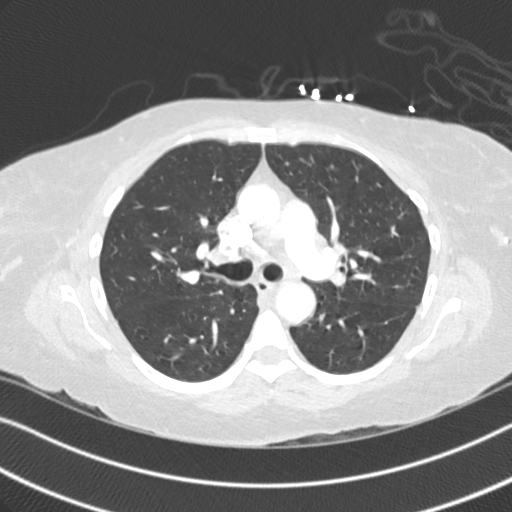
[im 268/402  soft-tissue]
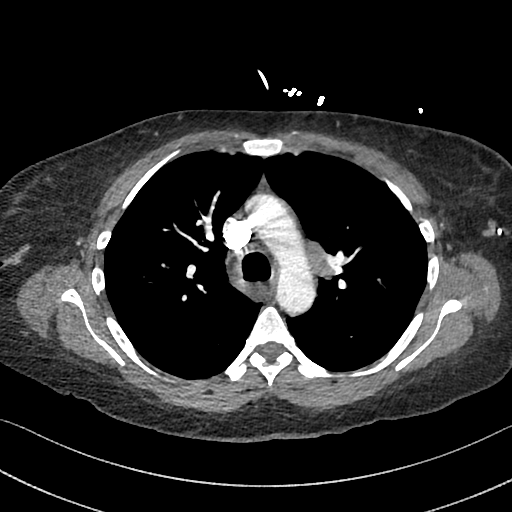
[im 290/402  lung]
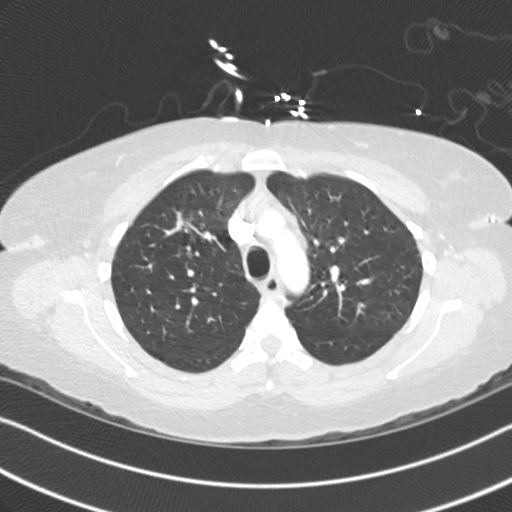
[im 312/402  soft-tissue]
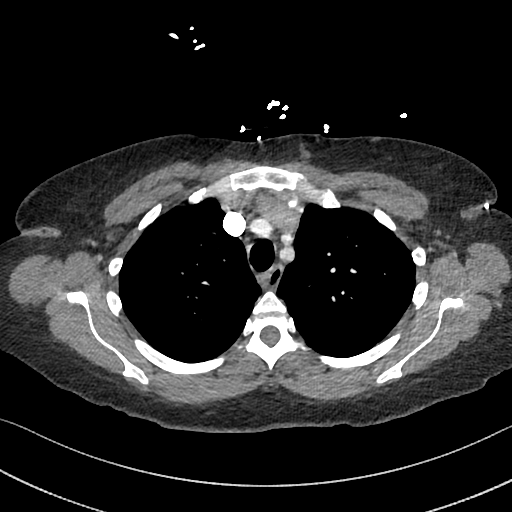
[im 357/402  lung]
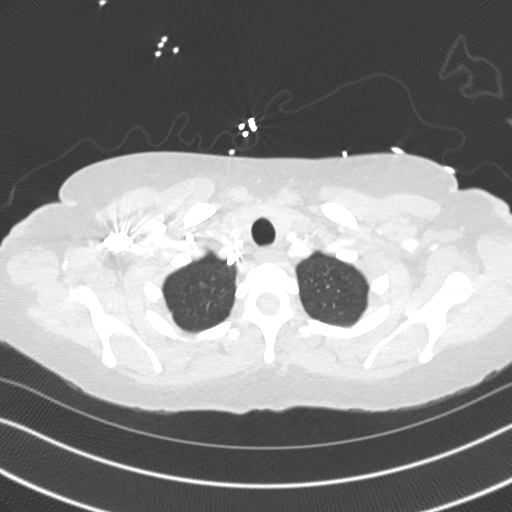
[im 379/402  soft-tissue]
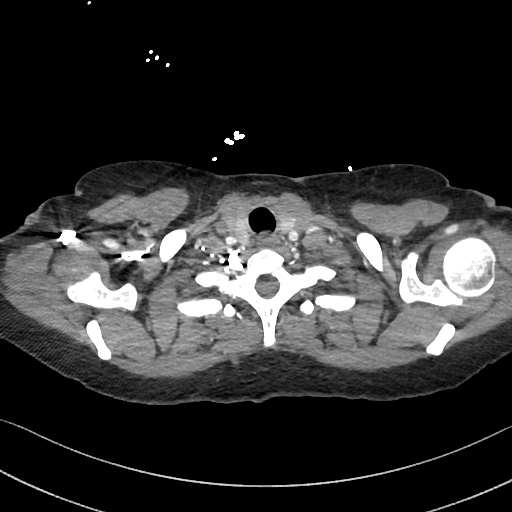

[Series 9: cor · coronal · 0.56mm/px · 3 of 140 slices shown]
[im 35/140  soft-tissue]
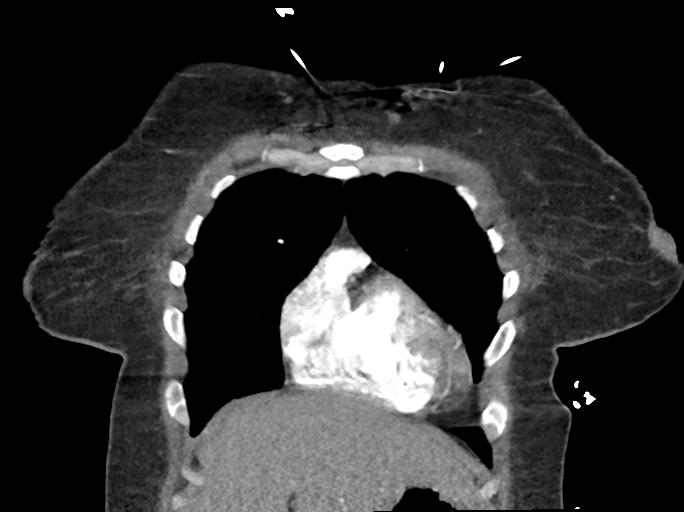
[im 70/140  soft-tissue]
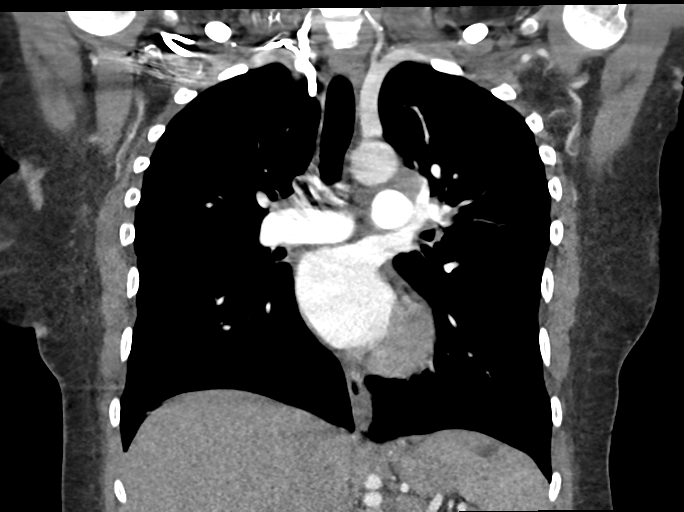
[im 105/140  soft-tissue]
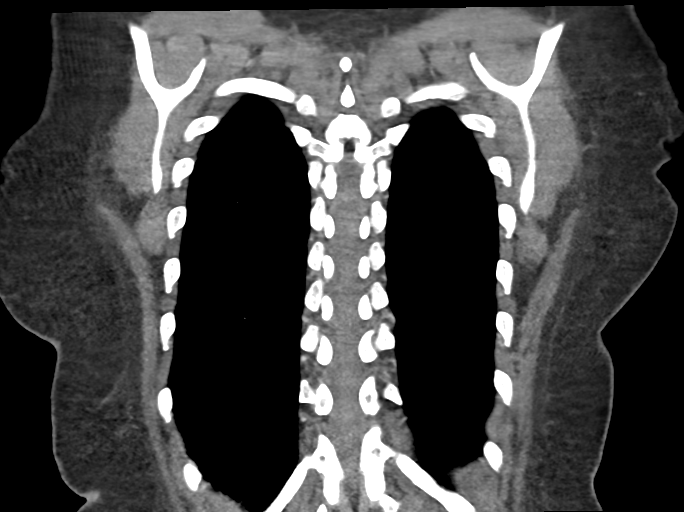

[17 of 46 positions shown; findings below may reference images not displayed]

FINDINGS: Cardiovascular: There is no demonstrable pulmonary embolus. There is
no thoracic aortic aneurysm or dissection. Visualized great vessels
appear unremarkable. Note that the right innominate and left common
carotid arteries arise as a common trunk, an anatomic variant. No
pericardial effusion or pericardial thickening evident. There are
scattered foci of coronary artery calcification.

Mediastinum/Nodes: No thyroid lesions are evident. There is focal
adenopathy at the aortopulmonary window measuring 2.1 x 1.5 cm.
There is a subcarinal lymph node measuring 1.1 x 1.1 cm. There is a
precarinal lymph node measuring 1.3 x 1.2 cm. There is a lymph node
to the left of the distal trachea measuring 1.4 x 1.3 cm. No
esophageal lesions are evident.

Lungs/Pleura: There is a nodular opacity in the anterior segment of
the left upper lobe measuring 1.3 x 1.1 cm, best appreciated on
axial slice 59 series 7. There is mild bibasilar atelectasis. There
is no edema or consolidation. No pleural effusions are appreciable.
There is slight upper and lower lobe bronchiectatic change
bilaterally.

Upper Abdomen: Visualized upper abdominal structures appear
unremarkable.

Musculoskeletal: There are no blastic or lytic bone lesions. No
evident chest wall lesions.

Review of the MIP images confirms the above findings.
IMPRESSION: 1. No demonstrable pulmonary embolus. No thoracic aortic aneurysm or
dissection. There are foci of coronary artery calcification.

2. There is a nodular opacity in the anterior segment left upper
lobe measuring 1.3 x 1.1 cm. No edema or airspace opacity.

3. Foci of adenopathy, most notably at the aortopulmonary window
region. Concern for underlying neoplasm. It may be prudent to
correlate with nuclear medicine PET study to further evaluate.

## 2024-02-17 ENCOUNTER — Other Ambulatory Visit (HOSPITAL_BASED_OUTPATIENT_CLINIC_OR_DEPARTMENT_OTHER): Payer: Self-pay
